# Patient Record
Sex: Female | Born: 1980 | Race: White | Hispanic: No | Marital: Married | Smoking: Never smoker
Health system: Southern US, Community
[De-identification: ages and names within clinical notes are randomized; demographics above are authoritative.]

## PROBLEM LIST (undated history)

## (undated) DIAGNOSIS — O139 Gestational [pregnancy-induced] hypertension without significant proteinuria, unspecified trimester: Secondary | ICD-10-CM

## (undated) DIAGNOSIS — F32A Depression, unspecified: Secondary | ICD-10-CM

## (undated) DIAGNOSIS — F329 Major depressive disorder, single episode, unspecified: Secondary | ICD-10-CM

## (undated) DIAGNOSIS — E282 Polycystic ovarian syndrome: Secondary | ICD-10-CM

## (undated) DIAGNOSIS — F419 Anxiety disorder, unspecified: Secondary | ICD-10-CM

## (undated) DIAGNOSIS — R51 Headache: Secondary | ICD-10-CM

## (undated) HISTORY — PX: MOUTH SURGERY: SHX715

## (undated) HISTORY — PX: LAPAROSCOPY: SHX197

## (undated) HISTORY — PX: TONSILLECTOMY: SUR1361

## (undated) HISTORY — DX: Major depressive disorder, single episode, unspecified: F32.9

## (undated) HISTORY — DX: Gestational (pregnancy-induced) hypertension without significant proteinuria, unspecified trimester: O13.9

## (undated) HISTORY — DX: Depression, unspecified: F32.A

## (undated) HISTORY — DX: Anxiety disorder, unspecified: F41.9

## (undated) HISTORY — DX: Headache: R51

## (undated) HISTORY — DX: Polycystic ovarian syndrome: E28.2

## (undated) HISTORY — PX: LEG SURGERY: SHX1003

---
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
## 2002-04-20 ENCOUNTER — Emergency Department (HOSPITAL_COMMUNITY): Admission: EM | Admit: 2002-04-20 | Discharge: 2002-04-20 | Payer: Self-pay | Admitting: Emergency Medicine

## 2002-06-24 ENCOUNTER — Ambulatory Visit (HOSPITAL_COMMUNITY): Admission: RE | Admit: 2002-06-24 | Discharge: 2002-06-24 | Payer: Self-pay | Admitting: Family Medicine

## 2002-06-24 ENCOUNTER — Encounter: Payer: Self-pay | Admitting: Family Medicine

## 2003-08-27 ENCOUNTER — Inpatient Hospital Stay (HOSPITAL_COMMUNITY): Admission: EM | Admit: 2003-08-27 | Discharge: 2003-08-30 | Payer: Self-pay

## 2004-02-15 ENCOUNTER — Emergency Department (HOSPITAL_COMMUNITY): Admission: EM | Admit: 2004-02-15 | Discharge: 2004-02-16 | Payer: Self-pay | Admitting: Emergency Medicine

## 2004-05-13 ENCOUNTER — Ambulatory Visit (HOSPITAL_COMMUNITY): Admission: RE | Admit: 2004-05-13 | Discharge: 2004-05-13 | Payer: Self-pay | Admitting: Family Medicine

## 2006-02-21 ENCOUNTER — Inpatient Hospital Stay (HOSPITAL_COMMUNITY): Admission: AD | Admit: 2006-02-21 | Discharge: 2006-02-21 | Payer: Self-pay | Admitting: Obstetrics and Gynecology

## 2006-04-11 ENCOUNTER — Inpatient Hospital Stay (HOSPITAL_COMMUNITY): Admission: AD | Admit: 2006-04-11 | Discharge: 2006-04-11 | Payer: Self-pay | Admitting: Obstetrics and Gynecology

## 2006-04-12 ENCOUNTER — Inpatient Hospital Stay (HOSPITAL_COMMUNITY): Admission: AD | Admit: 2006-04-12 | Discharge: 2006-04-15 | Payer: Self-pay | Admitting: Obstetrics and Gynecology

## 2007-01-24 ENCOUNTER — Emergency Department (HOSPITAL_COMMUNITY): Admission: EM | Admit: 2007-01-24 | Discharge: 2007-01-24 | Payer: Self-pay | Admitting: Emergency Medicine

## 2007-04-06 ENCOUNTER — Ambulatory Visit (HOSPITAL_COMMUNITY): Admission: RE | Admit: 2007-04-06 | Discharge: 2007-04-06 | Payer: Self-pay | Admitting: Obstetrics and Gynecology

## 2007-06-27 ENCOUNTER — Encounter: Payer: Self-pay | Admitting: Emergency Medicine

## 2007-06-28 ENCOUNTER — Observation Stay (HOSPITAL_COMMUNITY): Admission: EM | Admit: 2007-06-28 | Discharge: 2007-06-28 | Payer: Self-pay | Admitting: Internal Medicine

## 2010-10-05 HISTORY — PX: OTHER SURGICAL HISTORY: SHX169

## 2010-12-05 NOTE — L&D Delivery Note (Signed)
Delivery Note  SVD viable female Apgars 8,9 over 2nd degree midline laceration.  Placenta delivered spontaneously intact with 3VC. Repair with 2-0 Chromic with good support and hemostasis noted and R/V exam confirms.  Periurethral lac repaired as well  PH art was done.  [place] cord blood was not done.  Mother and baby were doing well.  EBL 400  Mother to AICU for PP mgso4 for Preeclampsia  Candice Camp, MD

## 2011-02-18 LAB — STREP B DNA PROBE: GBS: POSITIVE

## 2011-03-01 LAB — RUBELLA ANTIBODY, IGM: Rubella: IMMUNE

## 2011-03-01 LAB — ABO/RH

## 2011-03-01 LAB — STREP B DNA PROBE: GBS: POSITIVE

## 2011-03-18 LAB — GC/CHLAMYDIA PROBE AMP, GENITAL
Chlamydia: NEGATIVE
Gonorrhea: NEGATIVE

## 2011-03-23 ENCOUNTER — Inpatient Hospital Stay (HOSPITAL_COMMUNITY)
Admission: AD | Admit: 2011-03-23 | Discharge: 2011-03-23 | Disposition: A | Discharge status: Home / Self Care | Payer: BC Managed Care – PPO | Source: Ambulatory Visit | Attending: Obstetrics and Gynecology | Admitting: Obstetrics and Gynecology

## 2011-03-23 DIAGNOSIS — O210 Mild hyperemesis gravidarum: Secondary | ICD-10-CM | POA: Insufficient documentation

## 2011-03-23 LAB — URINALYSIS, ROUTINE W REFLEX MICROSCOPIC
Hgb urine dipstick: NEGATIVE
Ketones, ur: NEGATIVE mg/dL
Protein, ur: NEGATIVE mg/dL
pH: 5.5 (ref 5.0–8.0)

## 2011-03-23 LAB — COMPREHENSIVE METABOLIC PANEL
AST: 20 U/L (ref 0–37)
Albumin: 3.5 g/dL (ref 3.5–5.2)
Chloride: 103 mEq/L (ref 96–112)
Creatinine, Ser: 0.61 mg/dL (ref 0.4–1.2)
GFR calc Af Amer: >60 mL/min (ref >60)
GFR calc non Af Amer: >60 mL/min (ref >60)
Glucose, Bld: 77 mg/dL (ref 70–99)
Potassium: 3.8 mEq/L (ref 3.5–5.1)

## 2011-03-23 LAB — CBC
MCHC: 32.8 g/dL (ref 30.0–36.0)
WBC: 10.9 10*3/uL — ABNORMAL HIGH (ref 4.0–10.5)

## 2011-04-19 NOTE — H&P (Signed)
NAMECOLEEN, CARDIFF NO.:  [id number]   MEDICAL RECORD NO.:  [id number]          PATIENT TYPE:  EMS   LOCATION:  ED                           FACILITY:  [facility]   PHYSICIAN:  Hillery Aldo, M.D.   DATE OF BIRTH:  March 12, 1981   DATE OF ADMISSION:  06/27/2007  DATE OF DISCHARGE:                              HISTORY & PHYSICAL   PRIMARY CARE PHYSICIAN:  Dr. Creta Levin from [place].   CHIEF COMPLAINT:  Chest pain, dyspnea.   HISTORY OF PRESENT ILLNESS:  The patient is a 30 year old female with  past medical history of panic attacks who presented to the ER with the  sudden onset of intermittent chest pain that began initially at 2 p.m.  while the patient was at work.  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  It has been intermittent, sharp and was rated at 10/10 at its worst.  She also reports dyspnea since earlier this morning.  She claims to have  had a similar episode approximately 4 months ago but became concerned  because she does not usually have chest pain with her panic attacks.  Nevertheless, upon initial evaluation in the emergency department, she  remains quite anxious despite having received IV benzodiazepines and is  somewhat tachycardiac, and therefore we are going to admit her to break  this panic attack and to check her thyroid function.   PAST MEDICAL HISTORY:  1. Panic attacks.  2. Endometriosis.  3. Growth plate in the right leg removal.  4. History of migraine headaches.  5. Tonsillectomy.  6. History of viral meningitis.  7. Fatty infiltration of the liver.   FAMILY HISTORY:  The patient's mother is alive at 39 and has rheumatoid  arthritis and thyroid disease.  The patient's father is alive at 74 and  is healthy.  She has one sister who has asthma.   SOCIAL HISTORY:  The patient is married and lives with her husband.  She  is employed full-time in the billing department.   She denies any recent  or different stressors, although she is noted to have an 2-month-old  baby which could be contributory.  She denies tobacco, alcohol and drug  use.   ALLERGIES:  None.   MEDICATIONS:  1. Xanax 0.5 mg b.i.d.  2. Symbyax 6/25 mg daily.   REVIEW OF SYSTEMS:  The patient denies any fever or chills.  She has had  diminished appetite with two episodes of nausea accompanied by vomiting  today.  Chest pain as described above.  No changes in bowel habits.  Her  comprehensive 12-point review of systems is otherwise negative.   PHYSICAL EXAMINATION:  Temperature 98.1, pulse 103, respirations 17,  blood pressure 128/76, O2 saturation 98% on room air.  GENERAL:  This is a well-developed, slightly-obese female who is  somewhat anxious.  HEENT:  Normocephalic, atraumatic.  PERRL.  EOMI.  Oropharynx clear.  NECK:  Supple, no thyromegaly, no lymphadenopathy, no jugular venous  distension.  CHEST:  Lungs clear to auscultation bilaterally with good air  movement.  HEART:  Tachycardiac rate, regular rhythm.  No murmurs, rubs, gallops.  ABDOMEN:  Soft, nontender, nondistended with normoactive bowel sounds.  EXTREMITIES:  No clubbing, edema, cyanosis.  SKIN:  Warm and dry.  No rashes.  NEUROLOGIC:  The patient is alert and oriented x3.  Cranial nerves II-  XII grossly intact.  Moves all extremities x4 with equal strength.  Nonfocal.   DATA REVIEW:  EKG shows normal sinus rhythm with nonspecific T-wave  abnormalities.   Chest x-ray shows low lung volumes with minimal right basilar  atelectasis.   CT scan of the chest shows no pulmonary embolism.  There is a 6-mm  pulmonary nodule of the left lower lobe which is likely a noncalcified  granuloma.   Laboratory data:  White blood cell count was 10, hemoglobin 12.5,  hematocrit 26.1, platelets 325.  Absolute neutrophil count was 5.6.  D-  dimer was negative at 0.44.  Point-of-care cardiac markers are negative.  Sodium is 140,  potassium 3.9, chloride 106, bicarb 23, BUN 8, creatinine  0.74, glucose 91.  Liver function studies are within normal limits.   ASSESSMENT AND PLAN:  1. Panic attack:  We will admit the patient and administer      benzodiazepines p.r.n.  Given her sinus tachycardia, will also      check a TSH and a urine drug screen.  We will admit her for 23-hour      observation.  I will check a 12-lead EKG again in the morning.  The      patient has had three sets of point-of-care cardiac markers here in      the emergency department and there would be no further utility to      further checking cardiac markers given her age, and lack of any      significant risk factors for coronary disease.  2. Prophylaxis:  Will encourage early ambulation for DVT prophylaxis.      No GI prophylaxis will likely be needed as the patient is here for      23-hour observation only.      Hillery Aldo, M.D.  Electronically Signed     CR/MEDQ  D:  06/27/2007  T:  06/28/2007  Job:  [id number]   cc:   Warrick Parisian, MD  [facility]

## 2011-04-19 NOTE — Discharge Summary (Signed)
NAMESHENEKIA, RIESS NO.:  [id number]   MEDICAL RECORD NO.:  [id number]          PATIENT TYPE:  INP   LOCATION:  6736                         FACILITY:  [facility]   PHYSICIAN:  Marcellus Scott, MD     DATE OF BIRTH:  February 22, 1981   DATE OF ADMISSION:  06/28/2007  DATE OF DISCHARGE:  06/28/2007                               DISCHARGE SUMMARY   PRIMARY MEDICAL DOCTOR:  Dr. Creta Levin of [facility] in  [place], [place].   DISCHARGE DIAGNOSES:  1. Panic attacks.  2. Sinus tachycardia, resolved.  3. Lung nodule.  4. Obesity.   DISCHARGE MEDICATIONS:  1. Xanax 0.5 mg tab, 1 to 2 tabs p.o. three times a day p.r.n.  2. Symbyax 6/25 mg, one p.o. daily.   PROCEDURES:  1. A CT angiogram of the chest pulmonary embolism protocol with      impression:  No evidence of pulmonary embolus.  A 6 mm rounded      left lower lobe nodule.  Although nonspecific, a noncalcified      granuloma is favoured.  If the patient has risk factors for lung      cancer, this could be followed by a repeat CT in 6-12 months.      Fatty liver.  2. A chest x-ray.  Impression:  Low lung volumes with minimal right      basilar atelectasis.   PERTINENT LAB RESULTS:  TSH 1.59.  Urine drug screen positive for  benzodiazepines and opiates; however, the patient has received Dilaudid  and Xanax.  Point of care cardiac markers x2 were negative.  D-dimer  0.44.  hepatic panel normal.  Basic metabolic panel normal with BUN of  8, creatinine of 0.74.  CBC is normal with hemoglobin of 12.5,  hematocrit of 36.1, white blood cells 10, platelets of 325.  EKG:  Normal sinus rhythm.   CONSULTATIONS:  None.   HOSPITAL COURSE AND PATIENT DISPOSITION:  For details of the initial  part of the admission, please refer to the history and physical note  that was done by Dr. Darnelle Catalan.  In summary, Ms. Victoria Mccoy is a pleasant 30-  year-old Caucasian female patient with past medical history of panic  attacks, who presented with chest pain and dyspnea.  On further  evaluation she was assessed to have recurrence of her panic attacks with  associated sinus tachycardia.  Pulmonary embolism was ruled out by CT  angiogram of chest.  A cardiac source for her chest pain was deemed less  likely because there was no significant family history, atypical chest  pain history, no personal risk factors apart from being overweight,  negative point of care cardiac markers and no EKG changes.  The patient  was, however, admitted to the hospital for a 23-hour observation.  Her  TSH was checked and normal.  She was placed on Xanax p.r.n. and Symbyax.  Since then her symptoms have improved but she is still having occasional  panic attacks.  There are no known situations or precipitating factors.  When she has these attacks she becomes very anxious with associated  dyspnea, tingling of feet and fingers, tightness or pain of chest and  whole body. The soreness in her chest and body gradually subsides after  sometime after her panic attack has gone. A psychiatric consult was  suggested; however, the patient refused this and she and her husband  indicate that they will follow up with their primary medical doctor and  have made an appointment to see the PMD at 3:50pm this afternoon.  The  patient's sinus tachycardia has resolved.  For an incidental finding of  lung nodule, to be evaluated as outpatient as deemed necessary.  There  is no known risk for lung cancer in this patient.  For her obesity the  patient has been advised regarding diet, weight loss and exercise.      Marcellus Scott, MD  Electronically Signed     AH/MEDQ  D:  06/28/2007  T:  06/28/2007  Job:  [id number]   cc:   Dorisann Frames

## 2011-04-22 NOTE — Discharge Summary (Signed)
NAMEEBONIQUE, HALLSTROM                            ACCOUNT NO.:  [id number]   MEDICAL RECORD NO.:  [id number]                   PATIENT TYPE:  INP   LOCATION:  0383                                 FACILITY:  [facility]   PHYSICIAN:  Sherin Quarry, MD                   DATE OF BIRTH:  [birth year]   DATE OF ADMISSION:  08/27/2003  DATE OF DISCHARGE:  08/30/2003                                 DISCHARGE SUMMARY   HISTORY OF PRESENT ILLNESS:  Victoria Mccoy is a previously healthy 30 year old  young lady who presented to [facility] Emergency Room on August 27, 2003, with a 24 hour history of severe headache with associated fever,  malaise, nausea, vomiting, and anorexia.  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  On presentation to the emergency room,  a CT scan of the brain was done which showed a left maxillary sinus  infection.  Spinal fluid examination showed increased protein, and 181 white  cells with 5 red cells.  Gram stain and subsequent cultures were negative.  The patient was admitted with a presumptive diagnosis of aseptic meningitis.   PHYSICAL EXAMINATION:  VITAL SIGNS:  Temperature was 97.6, pulse was 110,  respirations 22, O2 saturation was 98%.  HEENT:  Remarkable for photophobia.  It was striking that the patient had no  meningismus.  She was able to flex and extend her neck normally.  CHEST:  Clear.  CARDIOVASCULAR:  Normal S1 and S2.  There were no murmurs, rubs, or gallops.  ABDOMEN:  Benign.  NEUROLOGIC:  Normal.  EXTREMITIES:  Normal.   LABORATORY DATA:  Sodium 136, potassium 4.1, chloride 106, bicarbonate 24,  creatinine was 0.9, BUN was 11, glucose was 101.  The Monospot was negative.  Lyme disease serology was negative.  Of interest, was that the herpes  simplex type 1 and type 2 antibodies were positive in both IgG and IgM.  This would be consistent  with previous herpes infection, but would not  necessarily indicate that the current problem was secondary to herpes.   HOSPITAL COURSE:  On admission, the patient was placed on IV of normal  saline 125 cc/hr, Zofran was administered 48 mg IV every six hours p.r.n.  for nausea.  The patient was administered Demerol for headache pain.  She  was also placed on Rocephin 2 g IV every 12 hours until spinal fluid studies  could rule out a bacterial meningitis.  Subsequently, the Rocephin was  discontinued, the patient was placed on Ceftin 250 mg b.i.d. because of the  presence of the maxillary sinusitis on CAT scan.  By August 30, 2003, the  patient was really feeling quite good.  She said that her headache was  essentially resolved.  She was  no longer vomiting.  She was ambulatory.  A  decision was made at that time to discharge her.   DISCHARGE DIAGNOSES:  1. Presumed viral meningitis.  2. Nausea and vomiting secondary to #1.   DISCHARGE MEDICATIONS:  1. The patient was advised to continue Ceftin 250 mg b.i.d. for a total of     10 days.  2. I also gave her Darvocet #20 with instructions to take one or two every     four hours p.r.n. for headache.   ACTIVITY:  She was instructed to stay home from work for about a week.   FOLLOWUP:  She was instructed to see Dr. Dayton Scrape back in the office on  Thursday or Friday, to check on her and also to write her a note to allow  her to return to work at the daycare center.   CONDITION ON DISCHARGE:  Good.                                               Sherin Quarry, MD    SY/MEDQ  D:  08/30/2003  T:  08/30/2003  Job:  [id number]   cc:   Meredith Staggers, M.D.  [address]  [place]  [place] [address]  Fax: [phone]

## 2011-04-22 NOTE — H&P (Signed)
NAMEAMARIANNA, Victoria Mccoy                            ACCOUNT NO.:  [id number]   MEDICAL RECORD NO.:  [id number]                   PATIENT TYPE:  EMS   LOCATION:  ED                                   FACILITY:  [facility]   PHYSICIAN:  Sherin Quarry, MD                   DATE OF BIRTH:  30-May-1981   DATE OF ADMISSION:  08/27/2003  DATE OF DISCHARGE:                                HISTORY & PHYSICAL   HISTORY OF PRESENT ILLNESS:  Victoria Mccoy is a previously healthy 30 year old  lady who presented to [facility] Emergency Room on August 27, 2003, with  onset the previous day of headache described as frontal and throbbing with  associated fever, malaise, nausea, vomiting, and anorexia.  There has been  no history of travel, tick exposure, unusual rash, animal or pet exposure.  Her husband had a virus a few days ago.  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  When she  was seen in the [facility] Emergency Room she was noted to have a  relatively severe headache and was also vomiting.  She was given both  Phenergan and Zofran for the vomiting and Demerol for the headache with  partial relief.  Because of the serious nature of the headache, further  testing was done.  This included a CT scan of the brain which was suggestive  of a left maxillary sinus infection.  The white count was 11,800.  There was  a normal BMET, normal urinalysis.  A spinal fluid examination was done which  showed a cell count on the fifth tube of a white count of 181, red blood  cell count of 5, 80% of the white cells were lymphocytes.  The glucose was  59, protein was 72, the Gram stain was negative.  These findings were felt  to be most compatible with a viral meningitis and she is admitted at this  time for management of this problem.   MEDICATIONS:  She takes no medications.   PAST MEDICAL HISTORY:  She has no serious illnesses.  Her parents recall  that she had  a tonsillectomy as a child and she had a fractured leg in 1994  which required operative treatment.   FAMILY HISTORY:  Everyone in the family is healthy except for the patient's  mother who has a history of rheumatoid arthritis and thyroid disease.   SOCIAL HISTORY:  As previously mentioned, she works at a daycare center.  She is married.  She does not have any children.  There is no history of  alcohol or drug abuse.   REVIEW OF SYSTEMS:  HEAD:  The patient is describing a frontal throbbing  headache.  EYES:  The patient's eyes are sensitive to the light.  EARS,  NOSE, AND THROAT:  There is no earache, sinus  pain, or sore throat.  CHEST:  She denies coughing, wheezing, or chest congestion.  CARDIOVASCULAR:  She  denies orthopnea, PND, ankle edema, or exertional chest pain.  GASTROINTESTINAL:  She has been vomiting.  There has been no abdominal pain.  There has been normal bowel function.  There has been no hematemesis or  melena.  GENITOURINARY:  She denies dysuria, urinary frequency, hesitancy,  or nocturia.  GYNECOLOGIC:  Her last menstrual period was in the last two  weeks.  There has been no unusual bleeding or discharge.  There is no  history of rash.  NEUROLOGIC:  There is no history of history of seizure or  stroke.  ENDOCRINE:  There is no history of excessive thirst, urinary  frequency, or nocturia.   PHYSICAL EXAMINATION:  GENERAL:  The patient is a moderately obese lady.  VITAL SIGNS:  Temperature is 97.6, the pulse is 110, respirations are 22, O2  saturation is 98%.  HEENT:  The eyes are sensitive to light.  The pupils are equal and reactive.  The tympanic membranes are clear.  Nares is patent.  Pharynx is without  erythema or exudate.  There really is very little in the way of meningismus.  The patient can easily flex and extend her neck.  CHEST:  Clear.  BACK:  No CVA or point tenderness.  CARDIOVASCULAR:  Normal S1 and S2.  There are no murmurs, rubs, or gallops.   ABDOMEN:  Obese, normal bowel sounds, without masses, tenderness, or  organomegaly.  NEUROLOGIC:  Cranial nerves, motor and sensory, and cerebellar testing is  normal.  Station and gait were not tested.  EXTREMITIES:  There is no rash.  There are no petechiae.  There are no  ecchymoses.  There is full range of motion.   IMPRESSION:  1. Probable viral meningitis.  2. Nausea and vomiting secondary to #1.   PLAN:  The patient will be admitted chiefly for symptomatic treatment.  We  will administer intravenous fluids and order medication for pain and for  nausea.  I explained to her family that she was likely to be ill for several  days, and that her improvement may be gradual.  To further evaluate her  diagnostically, we will obtain a herpes simplex virus serology by PCR,  Monospot, Lyme disease serology, and also a CMET to look at liver profile.  Also I am going to empirically put her on Rocephin 2 g IV q.12h. for 24  hours just to be absolutely sure that there is no evidence of bacterial  infection which is extremely unlikely.                                               Sherin Quarry, MD    SY/MEDQ  D:  08/27/2003  T:  08/27/2003  Job:  [id number]   cc:   Meredith Staggers, M.D.  [address]  [place]  [place] [address]  Fax: [phone]

## 2011-04-22 NOTE — Op Note (Signed)
[name], Victoria Mccoy NO.:  [id number]   MEDICAL RECORD NO.:  [id number]          PATIENT TYPE:  AMB   LOCATION:  SDC                           FACILITY:  [facility]   PHYSICIAN:  Juluis Mire, M.D.   DATE OF BIRTH:  Apr 06, 1981   DATE OF PROCEDURE:  04/26/2007  DATE OF DISCHARGE:                               OPERATIVE REPORT   PREOPERATIVE DIAGNOSIS:  Pelvic pain.   POSTOPERATIVE DIAGNOSIS:  Endometriosis.   OPERATIVE PROCEDURE:  Diagnostic laparoscopy with cautery of  endometriotic implant.   SURGEON:  Juluis Mire, MD   ANESTHESIA:  General orotracheal.   ESTIMATED BLOOD LOSS:  Minimal.   PACKS AND DRAINS:  None.   INTRAOPERATIVE BLOOD REPLACEMENT:  None.   COMPLICATIONS:  None.   INDICATIONS:  As noted in the history and physical.   PROCEDURE:  The patient was taken to the OR and placed in a supine  position.  After satisfactory level of general orotracheal anesthesia  was obtained, the abdomen, perineum and vagina were prepped out with  Betadine.  Bladder was emptied by in-and-out catheterization.  A Hulka  tenaculum was put in place and secured.  Patient was then draped into a  sterile field.  A subumbilical incision was made with a knife.  The  Veress needle was introduced in the abdominal cavity and the abdomen was  inflated with approximately 4 L of carbon dioxide.  At this point in  time, the operating laparoscope was then introduced.  A 5-mm trocar was  put in place in the suprapubic area under direct visualization.  Visualization revealed no evidence of injury to adjacent organs.  The  appendix was visualized and noted to be normal.  The upper abdomen  including liver and tip of the gallbladder were clear.  Both lateral  gutters were clear.  Uterus was normal size and shape.  Tubes and  ovaries were unremarkable.  In the cul-de-sac area , she did have  whitish lesions consistent with endometriosis directly in the cul-de-sac  and on the left  pelvic sidewall.  We identified the course of the  ureter.  Using the bipolar, we cauterized all areas of active  endometriosis.  At the end of the procedure, we thoroughly irrigated and  removed irrigation.  All areas of endometriosis were actively treated.  At this point in time, the abdomen was deflated of its carbon dioxide  and all trocars removed.  The subumbilical incision was closed with  interrupted subcuticulars of 4-0 Vicryl.  Suprapubic incision was closed  with Dermabond.  The Hulka tenaculum then removed and the patient taken out of the dorsal  lithotomy position and once alert and extubated, transferred to the  recovery room in good condition.  Sponge, instrument and needle count  was reported as correct by circulating nurse.      Juluis Mire, M.D.  Electronically Signed     JSM/MEDQ  D:  04/06/2007  T:  04/06/2007  Job:  [id number]

## 2011-04-22 NOTE — H&P (Signed)
NAME:  Victoria Mccoy, Victoria Mccoy NO.:  [id number]   MEDICAL RECORD NO.:  [id number]          PATIENT TYPE:  AMB   LOCATION:  SDC                           FACILITY:  [facility]   PHYSICIAN:  Juluis Mire, M.D.   DATE OF BIRTH:  06-27-1981   DATE OF ADMISSION:  04/06/2007  DATE OF DISCHARGE:                              HISTORY & PHYSICAL   HISTORY OF PRESENT ILLNESS:  The patient is a 30 year old gravida 1,  para 1 female presents for laparoscopy.   Since delivery of last child, the patient has had increasing pelvic pain  and discomfort.  Ultrasound evaluations have been unremarkable.  We have  discussed options for management and clean hormonal suppression versus  laparoscopy.  The patient is in favor of the latter procedure for which  she presents now.  Presumptively, may be dealing with endometriosis.   ALLERGIES:  NO KNOWN DRUG ALLERGIES.   MEDICATIONS:  Include vitamins.   PAST MEDICAL HISTORY:  Usual childhood diseases.  No significant  sequelae.  History of meningitis in 2004.   PAST SURGERIES:  She has had tonsillectomy and previous leg surgery.   OBSTETRICAL HISTORY:  One vaginal delivery.   FAMILY HISTORY:  Noncontributory.   FAMILY HISTORY:  Denies tobacco, alcohol use.   REVIEW OF SYSTEMS:  Noncontributory.   PHYSICAL EXAMINATION:  VITAL SIGNS:  Patient is afebrile, stable vital  signs.  HEENT: The patient is normocephalic.  Pupils equal and react to light  accommodation.  Extraocular movements were intact.  Sclerae and  conjunctivae clear.  Oropharynx clear.  NECK:  Without thyromegaly.  BREASTS:  No discrete masses.  LUNGS:  Clear.  CARDIOVASCULAR:  Regular rhythm rate without murmurs or gallops.  ABDOMEN:  Benign.  No mass, organomegaly or tenderness.  PELVIC:  Normal external genitalia.  Vaginal mucosa is clear.  Cervix  unremarkable.  Uterus normal size, shape and contour.  Mild cul-de-sac  tenderness.  Adnexa unremarkable.  EXTREMITIES:  Trace  edema.  NEUROLOGICAL:  Grossly within normal limits.   IMPRESSION:  Pelvic pain, rule out endometriosis.   PLAN:  The patient will undergo diagnostic laparoscopy, laser standby.  The risks of surgery have been discussed including the risk of  infection; the risk of hemorrhage that could require transfusion with  the risk of AIDS or  hepatitis; risk of injury to adjacent organs including bladder, bowel,  ureters that could require further exploratory surgery; risk of deep  venous thrombosis and pulmonary emboli.  The patient expressed  understanding of indications and risks.      Juluis Mire, M.D.  Electronically Signed     JSM/MEDQ  D:  04/06/2007  T:  04/06/2007  Job:  [id number]

## 2011-06-10 ENCOUNTER — Inpatient Hospital Stay (HOSPITAL_COMMUNITY)
Admission: EM | Admit: 2011-06-10 | Discharge: 2011-06-10 | Disposition: A | Discharge status: Home / Self Care | Payer: BC Managed Care – PPO | Source: Ambulatory Visit | Attending: Obstetrics and Gynecology | Admitting: Obstetrics and Gynecology

## 2011-06-10 DIAGNOSIS — O139 Gestational [pregnancy-induced] hypertension without significant proteinuria, unspecified trimester: Secondary | ICD-10-CM | POA: Insufficient documentation

## 2011-06-10 LAB — URINALYSIS, ROUTINE W REFLEX MICROSCOPIC
Glucose, UA: NEGATIVE mg/dL
Hgb urine dipstick: NEGATIVE
Nitrite: NEGATIVE
Urobilinogen, UA: 0.2 mg/dL (ref 0.0–1.0)

## 2011-06-13 LAB — URINE CULTURE: Culture  Setup Time: 201207070124

## 2011-09-15 ENCOUNTER — Telehealth (HOSPITAL_COMMUNITY): Payer: Self-pay | Admitting: *Deleted

## 2011-09-15 ENCOUNTER — Encounter (HOSPITAL_COMMUNITY): Payer: Self-pay | Admitting: *Deleted

## 2011-09-15 NOTE — Telephone Encounter (Signed)
Preadmission screen  

## 2011-09-16 ENCOUNTER — Encounter (HOSPITAL_COMMUNITY): Payer: Self-pay | Admitting: *Deleted

## 2011-09-19 ENCOUNTER — Encounter (HOSPITAL_COMMUNITY): Payer: Self-pay

## 2011-09-19 ENCOUNTER — Inpatient Hospital Stay (HOSPITAL_COMMUNITY)
Admission: RE | Admit: 2011-09-19 | Discharge: 2011-09-22 | DRG: 372 | Disposition: A | Discharge status: Home / Self Care | Payer: BC Managed Care – PPO | Source: Ambulatory Visit | Attending: Obstetrics and Gynecology | Admitting: Obstetrics and Gynecology

## 2011-09-19 DIAGNOSIS — O99892 Other specified diseases and conditions complicating childbirth: Secondary | ICD-10-CM | POA: Diagnosis present

## 2011-09-19 DIAGNOSIS — O139 Gestational [pregnancy-induced] hypertension without significant proteinuria, unspecified trimester: Secondary | ICD-10-CM

## 2011-09-19 DIAGNOSIS — Z22330 Carrier of Group B streptococcus: Secondary | ICD-10-CM

## 2011-09-19 DIAGNOSIS — IMO0002 Reserved for concepts with insufficient information to code with codable children: Secondary | ICD-10-CM | POA: Diagnosis present

## 2011-09-19 HISTORY — DX: Gestational (pregnancy-induced) hypertension without significant proteinuria, unspecified trimester: O13.9

## 2011-09-19 LAB — BASIC METABOLIC PANEL
BUN: 8
CO2: 23
Creatinine, Ser: 0.74
GFR calc Af Amer: >60
GFR calc non Af Amer: >60
Glucose, Bld: 91
Sodium: 140

## 2011-09-19 LAB — COMPREHENSIVE METABOLIC PANEL
AST: 19 U/L (ref 0–37)
Alkaline Phosphatase: 134 U/L — ABNORMAL HIGH (ref 39–117)
BUN: 7 mg/dL (ref 6–23)
CO2: 20 mEq/L (ref 19–32)
Chloride: 99 mEq/L (ref 96–112)
Creatinine, Ser: 0.56 mg/dL (ref 0.50–1.10)
GFR calc non Af Amer: >90 mL/min (ref >90)
Total Bilirubin: 0.2 mg/dL — ABNORMAL LOW (ref 0.3–1.2)

## 2011-09-19 LAB — CBC
HCT: 36.1
HCT: 36.6 % (ref 36.0–46.0)
Hemoglobin: 12.5
MCHC: 34.5
MCV: 86.7
MCV: 91 fL (ref 78.0–100.0)
Platelets: 325
RBC: 4.17
RBC: 4.02 MIL/uL (ref 3.87–5.11)
RDW: 13.2
WBC: 10
WBC: 10.8 10*3/uL — ABNORMAL HIGH (ref 4.0–10.5)

## 2011-09-19 LAB — POCT CARDIAC MARKERS
CKMB, poc: <1 — ABNORMAL LOW
CKMB, poc: <1 — ABNORMAL LOW
Myoglobin, poc: 52
Myoglobin, poc: 60.4
Operator id: 4661
Operator id: 4661
Troponin i, poc: <0.05
Troponin i, poc: <0.05

## 2011-09-19 LAB — RAPID URINE DRUG SCREEN, HOSP PERFORMED
Amphetamines: NOT DETECTED
Barbiturates: NOT DETECTED
Benzodiazepines: POSITIVE — AB
Cocaine: NOT DETECTED
Opiates: POSITIVE — AB
Tetrahydrocannabinol: NOT DETECTED

## 2011-09-19 LAB — URINALYSIS, ROUTINE W REFLEX MICROSCOPIC
Bilirubin Urine: NEGATIVE
Hgb urine dipstick: NEGATIVE
Ketones, ur: NEGATIVE mg/dL
Nitrite: NEGATIVE
pH: 7 (ref 5.0–8.0)

## 2011-09-19 LAB — HEPATIC FUNCTION PANEL
ALT: 33
AST: 30
Albumin: 3.8
Alkaline Phosphatase: 83
Bilirubin, Direct: <0.1
Total Bilirubin: 0.7
Total Protein: 7.2

## 2011-09-19 LAB — D-DIMER, QUANTITATIVE: D-Dimer, Quant: 0.44

## 2011-09-19 LAB — URINE CULTURE: Colony Count: >=100000

## 2011-09-19 LAB — DIFFERENTIAL
Basophils Absolute: 0
Basophils Relative: 0
Eosinophils Absolute: 0.4
Eosinophils Relative: 4
Lymphocytes Relative: 32
Lymphs Abs: 3.2
Monocytes Absolute: 0.5
Monocytes Relative: 5
Neutro Abs: 5.9
Neutrophils Relative %: 58

## 2011-09-19 LAB — URIC ACID: Uric Acid, Serum: 4.4 mg/dL (ref 2.4–7.0)

## 2011-09-19 LAB — URINE MICROSCOPIC-ADD ON

## 2011-09-19 LAB — BASIC METABOLIC PANEL WITH GFR
Calcium: 9.3
Chloride: 106
Potassium: 3.9

## 2011-09-19 LAB — LACTATE DEHYDROGENASE: LDH: 238 U/L (ref 94–250)

## 2011-09-19 LAB — TSH: TSH: 1.59

## 2011-09-19 MED ORDER — SODIUM CHLORIDE 0.9 % IV SOLN
2.0000 g | Freq: Once | INTRAVENOUS | Status: DC
  Administered 2011-09-19: 2 g via INTRAVENOUS
  Filled 2011-09-19: qty 2000

## 2011-09-19 MED ORDER — BUTORPHANOL TARTRATE 2 MG/ML IJ SOLN
1.0000 mg | INTRAMUSCULAR | Status: DC | PRN
  Administered 2011-09-20 (×2): 1 mg via INTRAVENOUS
  Filled 2011-09-19: qty 1

## 2011-09-19 MED ORDER — OXYTOCIN 20 UNITS IN LACTATED RINGERS INFUSION - SIMPLE: 125.0000 mL/h | Freq: Once | INTRAVENOUS | Status: DC

## 2011-09-19 MED ORDER — LABETALOL HCL 200 MG PO TABS
200.0000 mg | ORAL_TABLET | Freq: Two times a day (BID) | ORAL | Status: DC
  Administered 2011-09-19 – 2011-09-20 (×3): 200 mg via ORAL
  Filled 2011-09-19 (×4): qty 1

## 2011-09-19 MED ORDER — ACETAMINOPHEN 325 MG PO TABS
650.0000 mg | ORAL_TABLET | ORAL | Status: DC | PRN
  Administered 2011-09-19: 650 mg via ORAL
  Filled 2011-09-19: qty 1

## 2011-09-19 MED ORDER — LACTATED RINGERS IV SOLN
500.0000 mL | INTRAVENOUS | Status: DC | PRN
  Administered 2011-09-19: 500 mL via INTRAVENOUS

## 2011-09-19 MED ORDER — IBUPROFEN 600 MG PO TABS: 600.0000 mg | ORAL_TABLET | Freq: Four times a day (QID) | ORAL | Status: DC | PRN

## 2011-09-19 MED ORDER — OXYCODONE-ACETAMINOPHEN 5-325 MG PO TABS
2.0000 | ORAL_TABLET | ORAL | Status: DC | PRN
  Filled 2011-09-19: qty 1

## 2011-09-19 MED ORDER — FLEET ENEMA 7-19 GM/118ML RE ENEM: 1.0000 | ENEMA | RECTAL | Status: DC | PRN

## 2011-09-19 MED ORDER — CITRIC ACID-SODIUM CITRATE 334-500 MG/5ML PO SOLN: 30.0000 mL | ORAL | Status: DC | PRN

## 2011-09-19 MED ORDER — OXYTOCIN BOLUS FROM INFUSION
500.0000 mL | Freq: Once | INTRAVENOUS | Status: DC
  Filled 2011-09-19: qty 500
  Filled 2011-09-19: qty 1000

## 2011-09-19 MED ORDER — TERBUTALINE SULFATE 1 MG/ML IJ SOLN: 0.2500 mg | Freq: Once | INTRAMUSCULAR | Status: AC | PRN

## 2011-09-19 MED ORDER — LIDOCAINE HCL (PF) 1 % IJ SOLN
30.0000 mL | INTRAMUSCULAR | Status: DC | PRN
  Filled 2011-09-19: qty 30

## 2011-09-19 MED ORDER — LACTATED RINGERS IV SOLN
INTRAVENOUS | Status: DC
  Administered 2011-09-19 – 2011-09-20 (×3): via INTRAVENOUS

## 2011-09-19 MED ORDER — ONDANSETRON HCL 4 MG/2ML IJ SOLN: 4.0000 mg | Freq: Four times a day (QID) | INTRAMUSCULAR | Status: DC | PRN

## 2011-09-19 MED ORDER — MISOPROSTOL 25 MCG QUARTER TABLET
25.0000 ug | ORAL_TABLET | ORAL | Status: DC | PRN
  Administered 2011-09-19 – 2011-09-20 (×3): 25 ug via VAGINAL
  Filled 2011-09-19: qty 0.25
  Filled 2011-09-19: qty 1
  Filled 2011-09-19 (×2): qty 0.25

## 2011-09-19 NOTE — H&P (Signed)
Victoria Mccoy is a 30 y.o. female presenting for induction of labor secondary to worsening PIH.  Taking labetolol 200mg , bid and BPs going up over last week. Today C/O mild HA over several days, no blurry vision, no epigastric pain, no ROM/bleeding. Maternal Medical History:  Fetal activity: Perceived fetal activity is normal.      OB History    Grav Para Term Preterm Abortions TAB SAB Ect Mult Living   2 1 1  0 0 0 0 0 0 1     Past Medical History  Diagnosis Date  . PCOS (polycystic ovarian syndrome)   . Endometriosis 2008    mild  . Pregnancy induced hypertension     with last pregnancy and this one  . Headache     migraines  . Depression     pp depression  . Anxiety     hx anxiety attacks  . Asthma    Past Surgical History  Procedure Date  . Laparoscopy with co2 laser ablation 10/2010  . Tonsillectomy     30 years old  . Leg surgery     R leg growth plate 30 years old  . Mouth surgery   . Laparoscopy     111/2011   Family History: family history includes Arthritis in her mother; Cancer in her maternal grandfather and paternal grandfather; Diabetes in her maternal grandmother; Hypertension in her maternal grandmother and paternal grandmother; and Thyroid disease in her mother.  There is no history of Anesthesia problems, and Hypotension, and Malignant hyperthermia, and Pseudochol deficiency, . Social History:  reports that she has never smoked. She has never used smokeless tobacco. She reports that she does not drink alcohol or use illicit drugs.  Review of Systems  Eyes: Negative for blurred vision.  Gastrointestinal: Negative for abdominal pain.  Neurological: Positive for headaches.    Dilation: 2 Effacement (%): 40 Exam by:: l.poore, rn Blood pressure 157/107, pulse 122, temperature 98.6 F (37 C), temperature source Oral, resp. rate 18, height 5\' 4"  (1.626 m), weight 112.492 kg (248 lb), last menstrual period 12/24/2010. Maternal Exam:  Abdomen: Fetal  presentation: vertex     Physical Exam  Constitutional: She appears well-developed and well-nourished.  Cardiovascular: Normal rate and regular rhythm.   Respiratory: Effort normal and breath sounds normal.  GI: There is no tenderness.  Neurological: She has normal reflexes.   U/S at bedside confirms vtx. Prenatal labs: ABO, Rh: A/Positive/-- (03/27 0000) Antibody: Negative (03/27 0000) Rubella: Immune (03/27 0000) RPR: Nonreactive (03/27 0000)  HBsAg: Negative (03/27 0000)  HIV: Non-reactive (03/27 0000)  GBS: Positive (03/27 0000)   Assessment/Plan: 30 yo G2P1 at 66 5/7 weeks with HTN and worsening PIH.  Mild HA for several days.  Will give tylenol while labs and UA are being done.  Continue labetolol 200mg , bid.  If HA persists, labs are suspicious, will begin Magnesium sulfate for seizure prophylaxis. D/W pt risks of induction of labor including uterine hyperstimulation, uterine rupture, fetal distress, emergency C/S.   Kameran Lallier II,Juanjose Mojica E 09/19/2011, 8:59 PM

## 2011-09-19 NOTE — Plan of Care (Signed)
Problem: Consults Goal: Birthing Suites Patient Information Press F2 to bring up selections list  Outcome: Completed/Met Date Met:  09/19/11  Pt 37-[redacted] weeks EGA, Inpatient induction and PIH (Pregnancy induced hypertension)

## 2011-09-20 ENCOUNTER — Inpatient Hospital Stay (HOSPITAL_COMMUNITY): Payer: BC Managed Care – PPO | Admitting: Anesthesiology

## 2011-09-20 ENCOUNTER — Encounter (HOSPITAL_COMMUNITY): Payer: Self-pay

## 2011-09-20 ENCOUNTER — Encounter (HOSPITAL_COMMUNITY): Payer: Self-pay | Admitting: Anesthesiology

## 2011-09-20 LAB — CBC
MCH: 30.3 pg (ref 26.0–34.0)
MCHC: 32.8 g/dL (ref 30.0–36.0)
MCV: 92.3 fL (ref 78.0–100.0)
Platelets: 257 10*3/uL (ref 150–400)
Platelets: 256 10*3/uL (ref 150–400)
RBC: 3.78 MIL/uL — ABNORMAL LOW (ref 3.87–5.11)
RDW: 14.5 % (ref 11.5–15.5)
WBC: 11.1 10*3/uL — ABNORMAL HIGH (ref 4.0–10.5)

## 2011-09-20 MED ORDER — DIPHENHYDRAMINE HCL 25 MG PO CAPS: 25.0000 mg | ORAL_CAPSULE | Freq: Four times a day (QID) | ORAL | Status: DC | PRN

## 2011-09-20 MED ORDER — LACTATED RINGERS IV SOLN: 500.0000 mL | Freq: Once | INTRAVENOUS | Status: DC

## 2011-09-20 MED ORDER — FENTANYL 2.5 MCG/ML BUPIVACAINE 1/10 % EPIDURAL INFUSION (WH - ANES)
14.0000 mL/h | INTRAMUSCULAR | Status: DC
  Administered 2011-09-20 (×2): 14 mL/h via EPIDURAL
  Filled 2011-09-20 (×2): qty 60

## 2011-09-20 MED ORDER — WITCH HAZEL-GLYCERIN EX PADS: 1.0000 "application " | MEDICATED_PAD | CUTANEOUS | Status: DC | PRN

## 2011-09-20 MED ORDER — MAGNESIUM SULFATE 40 G IN LACTATED RINGERS - SIMPLE
2.0000 g/h | INTRAVENOUS | Status: DC
  Administered 2011-09-20: 2 g/h via INTRAVENOUS
  Filled 2011-09-20: qty 500

## 2011-09-20 MED ORDER — LANOLIN HYDROUS EX OINT: TOPICAL_OINTMENT | CUTANEOUS | Status: DC | PRN

## 2011-09-20 MED ORDER — SODIUM CHLORIDE 0.9 % IV SOLN
2.0000 g | INTRAVENOUS | Status: DC
  Administered 2011-09-20 (×2): 2 g via INTRAVENOUS
  Filled 2011-09-20 (×6): qty 2000

## 2011-09-20 MED ORDER — BENZOCAINE-MENTHOL 20-0.5 % EX AERO
1.0000 "application " | INHALATION_SPRAY | CUTANEOUS | Status: DC | PRN
  Administered 2011-09-20: 1 via TOPICAL

## 2011-09-20 MED ORDER — OXYCODONE-ACETAMINOPHEN 5-325 MG PO TABS
1.0000 | ORAL_TABLET | ORAL | Status: DC | PRN
  Administered 2011-09-20: 2 via ORAL
  Administered 2011-09-20: 1 via ORAL
  Administered 2011-09-21 – 2011-09-22 (×6): 2 via ORAL
  Administered 2011-09-22: 1 via ORAL
  Filled 2011-09-20 (×8): qty 2
  Filled 2011-09-20: qty 1

## 2011-09-20 MED ORDER — EPHEDRINE 5 MG/ML INJ
10.0000 mg | INTRAVENOUS | Status: DC | PRN
  Filled 2011-09-20 (×2): qty 4

## 2011-09-20 MED ORDER — TETANUS-DIPHTH-ACELL PERTUSSIS 5-2.5-18.5 LF-MCG/0.5 IM SUSP
0.5000 mL | Freq: Once | INTRAMUSCULAR | Status: DC
  Filled 2011-09-20: qty 0.5

## 2011-09-20 MED ORDER — OXYTOCIN 20 UNITS IN LACTATED RINGERS INFUSION - SIMPLE
1.0000 m[IU]/min | INTRAVENOUS | Status: DC
  Administered 2011-09-20: 2 m[IU]/min via INTRAVENOUS
  Filled 2011-09-20: qty 1000

## 2011-09-20 MED ORDER — PHENYLEPHRINE 40 MCG/ML (10ML) SYRINGE FOR IV PUSH (FOR BLOOD PRESSURE SUPPORT)
80.0000 ug | PREFILLED_SYRINGE | INTRAVENOUS | Status: DC | PRN
  Filled 2011-09-20 (×2): qty 5

## 2011-09-20 MED ORDER — MEDROXYPROGESTERONE ACETATE 150 MG/ML IM SUSP: 150.0000 mg | INTRAMUSCULAR | Status: DC | PRN

## 2011-09-20 MED ORDER — ONDANSETRON HCL 4 MG/2ML IJ SOLN: 4.0000 mg | INTRAMUSCULAR | Status: DC | PRN

## 2011-09-20 MED ORDER — LIDOCAINE HCL 1.5 % IJ SOLN
INTRAMUSCULAR | Status: DC | PRN
  Administered 2011-09-20 (×2): 5 mL via INTRADERMAL

## 2011-09-20 MED ORDER — ONDANSETRON HCL 4 MG PO TABS: 4.0000 mg | ORAL_TABLET | ORAL | Status: DC | PRN

## 2011-09-20 MED ORDER — CITRIC ACID-SODIUM CITRATE 334-500 MG/5ML PO SOLN
ORAL | Status: AC
  Filled 2011-09-20: qty 15

## 2011-09-20 MED ORDER — PHENYLEPHRINE 40 MCG/ML (10ML) SYRINGE FOR IV PUSH (FOR BLOOD PRESSURE SUPPORT)
80.0000 ug | PREFILLED_SYRINGE | INTRAVENOUS | Status: DC | PRN
  Filled 2011-09-20: qty 5

## 2011-09-20 MED ORDER — LACTATED RINGERS IV SOLN
INTRAVENOUS | Status: DC
  Administered 2011-09-20 – 2011-09-21 (×2): via INTRAVENOUS

## 2011-09-20 MED ORDER — SENNOSIDES-DOCUSATE SODIUM 8.6-50 MG PO TABS
2.0000 | ORAL_TABLET | Freq: Every day | ORAL | Status: DC
  Administered 2011-09-20 – 2011-09-21 (×2): 2 via ORAL

## 2011-09-20 MED ORDER — BENZOCAINE-MENTHOL 20-0.5 % EX AERO
INHALATION_SPRAY | CUTANEOUS | Status: AC
  Administered 2011-09-20: 1 via TOPICAL
  Filled 2011-09-20: qty 56

## 2011-09-20 MED ORDER — IBUPROFEN 600 MG PO TABS
600.0000 mg | ORAL_TABLET | Freq: Four times a day (QID) | ORAL | Status: DC
  Administered 2011-09-20 – 2011-09-22 (×8): 600 mg via ORAL
  Filled 2011-09-20 (×9): qty 1

## 2011-09-20 MED ORDER — PRENATAL PLUS 27-1 MG PO TABS
1.0000 | ORAL_TABLET | Freq: Every day | ORAL | Status: DC
  Administered 2011-09-21 – 2011-09-22 (×2): 1 via ORAL
  Filled 2011-09-20 (×2): qty 1

## 2011-09-20 MED ORDER — DIPHENHYDRAMINE HCL 50 MG/ML IJ SOLN: 12.5000 mg | INTRAMUSCULAR | Status: DC | PRN

## 2011-09-20 MED ORDER — LACTATED RINGERS IV SOLN
INTRAVENOUS | Status: DC
  Administered 2011-09-20: 14:00:00 via INTRAUTERINE

## 2011-09-20 MED ORDER — SIMETHICONE 80 MG PO CHEW: 80.0000 mg | CHEWABLE_TABLET | ORAL | Status: DC | PRN

## 2011-09-20 MED ORDER — MEASLES, MUMPS & RUBELLA VAC ~~LOC~~ INJ
0.5000 mL | INJECTION | Freq: Once | SUBCUTANEOUS | Status: DC
  Filled 2011-09-20: qty 0.5

## 2011-09-20 MED ORDER — EPHEDRINE 5 MG/ML INJ
10.0000 mg | INTRAVENOUS | Status: DC | PRN
  Filled 2011-09-20: qty 4

## 2011-09-20 MED ORDER — ZOLPIDEM TARTRATE 5 MG PO TABS: 5.0000 mg | ORAL_TABLET | Freq: Every evening | ORAL | Status: DC | PRN

## 2011-09-20 MED ORDER — MAGNESIUM SULFATE BOLUS VIA INFUSION
4.0000 g | Freq: Once | INTRAVENOUS | Status: AC
  Administered 2011-09-20: 4 g via INTRAVENOUS
  Filled 2011-09-20: qty 500

## 2011-09-20 MED ORDER — DIBUCAINE 1 % RE OINT: 1.0000 "application " | TOPICAL_OINTMENT | RECTAL | Status: DC | PRN

## 2011-09-20 MED ORDER — TERBUTALINE SULFATE 1 MG/ML IJ SOLN: 0.2500 mg | Freq: Once | INTRAMUSCULAR | Status: DC | PRN

## 2011-09-20 NOTE — Anesthesia Postprocedure Evaluation (Signed)
  Anesthesia Post-op Note  Patient: Victoria Mccoy  Procedure(s) Performed: * No procedures listed *  Patient Location: 372   Anesthesia Type: Epidural  Level of Consciousness: awake, alert  and oriented  Airway and Oxygen Therapy: Patient Spontanous Breathing  Post-op Pain: mild  Post-op Assessment: Post-op Vital signs reviewed, Patient's Cardiovascular Status Stable, No headache, No backache, No residual numbness and No residual motor weakness  Post-op Vital Signs: Reviewed and stable  Complications: No apparent anesthesia complications

## 2011-09-20 NOTE — Progress Notes (Signed)
Pt. Attempted to ambulate to bathroom but unable to do so because of dizziness.  Straight cathed for 350cc clear yellow urine.  Pericare done and pad placed.  Pt.  In wheelchair and transferred to room 372 without difficulty.  Family at side.

## 2011-09-20 NOTE — Progress Notes (Signed)
Delivered viable female with apgars 8/9.  Placenta at 1451.  2nd degree perineal laceration repaired.  Pitocin 20 units  At bolus rate past placenta.

## 2011-09-20 NOTE — Anesthesia Preprocedure Evaluation (Signed)
Anesthesia Evaluation  Name, MR# and DOB Patient awake  General Assessment Comment  Reviewed: Allergy & Precautions, H&P , Patient's Chart, lab work & pertinent test results  Airway Mallampati: III TM Distance: >3 FB Neck ROM: full    Dental No notable dental hx.    Pulmonary asthma  clear to auscultation  Pulmonary exam normal       Cardiovascular hypertension, regular Normal    Neuro/Psych Negative Neurological ROS  Negative Psych ROS   GI/Hepatic negative GI ROS Neg liver ROS    Endo/Other  Negative Endocrine ROSMorbid obesity  Renal/GU negative Renal ROS     Musculoskeletal   Abdominal   Peds  Hematology negative hematology ROS (+)   Anesthesia Other Findings pih  Reproductive/Obstetrics (+) Pregnancy                           Anesthesia Physical Anesthesia Plan  ASA: III  Anesthesia Plan: Epidural   Post-op Pain Management:    Induction:   Airway Management Planned:   Additional Equipment:   Intra-op Plan:   Post-operative Plan:   Informed Consent: I have reviewed the patients History and Physical, chart, labs and discussed the procedure including the risks, benefits and alternatives for the proposed anesthesia with the patient or authorized representative who has indicated his/her understanding and acceptance.     Plan Discussed with:   Anesthesia Plan Comments:         Anesthesia Quick Evaluation

## 2011-09-20 NOTE — Progress Notes (Signed)
Victoria Mccoy is a 30 y.o. G2P1001 at [redacted]w[redacted]d by LMP admitted for induction of labor due to Hypertension.  Subjective:   Objective: BP 145/87  Pulse 97  Temp(Src) 98.5 F (36.9 C) (Oral)  Resp 18  Ht 5\' 4"  (1.626 m)  Wt 112.492 kg (248 lb)  BMI 42.57 kg/m2  SpO2 98%  LMP 12/24/2010 I/O last 3 completed shifts: In: 1945.4 [P.O.:500; I.V.:1445.4] Out: 1000 [Urine:1000] Total I/O In: 225 [P.O.:100; I.V.:125] Out: -   FHT:  FHR: 140 bpm, variability: moderate,  accelerations:  Present,  decelerations:  Absent UC:   irregular, every 5 minutes, regular, every  minutes SVE:   Dilation: 2 Effacement (%): 60;70 Station: -3 Exam by:: l.poore, rn  Labs: Lab Results  Component Value Date   WBC 10.8* 09/19/2011   HGB 12.2 09/19/2011   HCT 36.6 09/19/2011   MCV 91.0 09/19/2011   PLT 286 09/19/2011    Assessment / Plan: Induction of labor due to preeclampsia,  progressing well on pitocin  Labor: Progressing normally Preeclampsia:  on magnesium sulfate Fetal Wellbeing:  Category I and  Pain Control:  Labor support without medications I/D:  n/a Anticipated MOD:  NSVD abx for gbs Epidural prn Lennard Capek C 09/20/2011, 8:30 AM

## 2011-09-20 NOTE — Anesthesia Procedure Notes (Signed)
Epidural Patient location during procedure: OB Start time: 09/20/2011 10:40 AM  Staffing Performed by: anesthesiologist   Preanesthetic Checklist Completed: patient identified, site marked, surgical consent, pre-op evaluation, timeout performed, IV checked, risks and benefits discussed and monitors and equipment checked  Epidural Patient position: sitting Prep: site prepped and draped and DuraPrep Patient monitoring: continuous pulse ox and blood pressure Approach: midline Injection technique: LOR air and LOR saline  Needle:  Needle type: Tuohy  Needle gauge: 17 G Needle length: 9 cm Needle insertion depth: 6 cm Catheter type: closed end flexible Catheter size: 19 Gauge Catheter at skin depth: 11 cm Test dose: negative  Assessment Events: blood not aspirated, injection not painful, no injection resistance, negative IV test and no paresthesia  Additional Notes Patient identified.  Risk benefits discussed including failed block, incomplete pain control, headache, nerve damage, paralysis, blood pressure changes, nausea, vomiting, reactions to medication both toxic or allergic, and postpartum back pain.  Patient expressed understanding and wished to proceed.  All questions were answered.  Sterile technique used throughout procedure and epidural site dressed with sterile barrier dressing. No paresthesia or other complications noted.The patient did not experience any signs of intravascular injection such as tinnitus or metallic taste in mouth nor signs of intrathecal spread such as rapid motor block. Please see nursing notes for vital signs.

## 2011-09-21 LAB — COMPREHENSIVE METABOLIC PANEL
ALT: 8 U/L (ref 0–35)
AST: 13 U/L (ref 0–37)
Albumin: 2.4 g/dL — ABNORMAL LOW (ref 3.5–5.2)
Alkaline Phosphatase: 97 U/L (ref 39–117)
Chloride: 106 mEq/L (ref 96–112)
Potassium: 3.7 mEq/L (ref 3.5–5.1)
Sodium: 138 mEq/L (ref 135–145)
Total Bilirubin: 0.2 mg/dL — ABNORMAL LOW (ref 0.3–1.2)

## 2011-09-21 LAB — CBC
Platelets: 218 10*3/uL (ref 150–400)
RDW: 14.8 % (ref 11.5–15.5)
WBC: 11.8 10*3/uL — ABNORMAL HIGH (ref 4.0–10.5)

## 2011-09-21 LAB — URIC ACID: Uric Acid, Serum: 5.4 mg/dL (ref 2.4–7.0)

## 2011-09-21 NOTE — Progress Notes (Signed)
Post Partum Day one Subjective: no complaints  Objective: Blood pressure 126/84, pulse 78, temperature 97.7 F (36.5 C), temperature source Oral, resp. rate 20, height 5\' 4"  (1.626 m), weight 239 lb 11.2 oz (108.727 kg), last menstrual period 12/24/2010, SpO2 99.00%, unknown if currently breastfeeding.  Physical Exam:  General: alert Lochia: appropriate Uterine Fundus: firm DTR" 1 + DVT Evaluation: No evidence of DVT seen on physical exam.   Basename 09/21/11 0535 09/20/11 1623  HGB 9.0* 10.8*  HCT 27.6* 32.9*    Assessment/Plan: Plan for discharge tomorrow.  D/c magnesium   LOS: 2 days   Victoria Mccoy 09/21/2011, 9:05 AM

## 2011-09-21 NOTE — Progress Notes (Signed)
UR chart review completed.  

## 2011-09-22 LAB — CBC
HCT: 28.1 % — ABNORMAL LOW (ref 36.0–46.0)
Hemoglobin: 8.9 g/dL — ABNORMAL LOW (ref 12.0–15.0)
MCV: 95.3 fL (ref 78.0–100.0)
RDW: 15.2 % (ref 11.5–15.5)
WBC: 14.1 10*3/uL — ABNORMAL HIGH (ref 4.0–10.5)

## 2011-09-22 MED ORDER — ONDANSETRON HCL 4 MG/2ML IJ SOLN: 4.0000 mg | INTRAMUSCULAR | Status: DC | PRN

## 2011-09-22 MED ORDER — BISACODYL 10 MG RE SUPP: 10.0000 mg | Freq: Every day | RECTAL | Status: DC | PRN

## 2011-09-22 MED ORDER — ONDANSETRON HCL 4 MG PO TABS: 4.0000 mg | ORAL_TABLET | ORAL | Status: DC | PRN

## 2011-09-22 MED ORDER — ZOLPIDEM TARTRATE 5 MG PO TABS: 5.0000 mg | ORAL_TABLET | Freq: Every evening | ORAL | Status: DC | PRN

## 2011-09-22 MED ORDER — IBUPROFEN 600 MG PO TABS: 600.0000 mg | ORAL_TABLET | Freq: Four times a day (QID) | ORAL | Status: AC

## 2011-09-22 MED ORDER — SIMETHICONE 80 MG PO CHEW: 80.0000 mg | CHEWABLE_TABLET | ORAL | Status: DC | PRN

## 2011-09-22 MED ORDER — OXYCODONE-ACETAMINOPHEN 5-325 MG PO TABS: 1.0000 | ORAL_TABLET | ORAL | Status: DC | PRN

## 2011-09-22 MED ORDER — IBUPROFEN 600 MG PO TABS: 600.0000 mg | ORAL_TABLET | Freq: Four times a day (QID) | ORAL | Status: DC

## 2011-09-22 MED ORDER — LANOLIN HYDROUS EX OINT: TOPICAL_OINTMENT | CUTANEOUS | Status: DC | PRN

## 2011-09-22 MED ORDER — DIBUCAINE 1 % RE OINT: 1.0000 "application " | TOPICAL_OINTMENT | RECTAL | Status: DC | PRN

## 2011-09-22 MED ORDER — DIPHENHYDRAMINE HCL 25 MG PO CAPS: 25.0000 mg | ORAL_CAPSULE | Freq: Four times a day (QID) | ORAL | Status: DC | PRN

## 2011-09-22 MED ORDER — PRENATAL PLUS 27-1 MG PO TABS: 1.0000 | ORAL_TABLET | Freq: Every day | ORAL | Status: DC

## 2011-09-22 MED ORDER — WITCH HAZEL-GLYCERIN EX PADS: 1.0000 "application " | MEDICATED_PAD | CUTANEOUS | Status: DC | PRN

## 2011-09-22 MED ORDER — OXYCODONE-ACETAMINOPHEN 5-325 MG PO TABS: 1.0000 | ORAL_TABLET | ORAL | Status: AC | PRN

## 2011-09-22 MED ORDER — SENNOSIDES-DOCUSATE SODIUM 8.6-50 MG PO TABS: 2.0000 | ORAL_TABLET | Freq: Every day | ORAL | Status: DC

## 2011-09-22 MED ORDER — BENZOCAINE-MENTHOL 20-0.5 % EX AERO: 1.0000 "application " | INHALATION_SPRAY | CUTANEOUS | Status: DC | PRN

## 2011-09-22 MED ORDER — FLEET ENEMA 7-19 GM/118ML RE ENEM: 1.0000 | ENEMA | RECTAL | Status: DC | PRN

## 2011-09-22 MED ORDER — TETANUS-DIPHTH-ACELL PERTUSSIS 5-2.5-18.5 LF-MCG/0.5 IM SUSP: 0.5000 mL | Freq: Once | INTRAMUSCULAR | Status: DC

## 2011-09-22 NOTE — Progress Notes (Signed)
Post Partum Day 2 Subjective: no complaints, up ad lib and + flatus  Objective: Blood pressure 112/71, pulse 79, temperature 98.4 F (36.9 C), temperature source Oral, resp. rate 18, height 5\' 4"  (1.626 m), weight 108.727 kg (239 lb 11.2 oz), last menstrual period 12/24/2010, SpO2 99.00%, unknown if currently breastfeeding.  Physical Exam:  General: alert and cooperative Lochia: appropriate Uterine Fundus: firm Incision: healing well DVT Evaluation: No evidence of DVT seen on physical exam.   Basename 09/22/11 0508 09/21/11 0535  HGB 8.9* 9.0*  HCT 28.1* 27.6*    Assessment/Plan: Discharge home   LOS: 3 days   Fabian Walder G 09/22/2011, 7:54 AM

## 2011-09-22 NOTE — Discharge Summary (Signed)
Obstetric Discharge Summary Reason for Admission: induction of labor Prenatal Procedures: none Intrapartum Procedures: spontaneous vaginal delivery Postpartum Procedures:pp magnesium sulfate Complications-Operative and Postpartum: 2 degree perineal laceration Hemoglobin  Date Value Range Status  09/22/2011 8.9* 12.0-15.0 (Mccoy/dL) Final     HCT  Date Value Range Status  09/22/2011 28.1* 36.0-46.0 (%) Final    Discharge Diagnoses: Term Pregnancy-delivered and Preelampsia  Discharge Information: Date: 09/22/2011 Activity: pelvic rest Diet: routine Medications: PNV, Ibuprofen and Percocet Condition: stable Instructions: refer to practice specific booklet Discharge to: home   Newborn Data: Live born female  Birth Weight: 6 lb 1.7 oz (2770 Mccoy) APGAR: 8, 9  Home with mother.  Victoria Mccoy 09/22/2011, 8:01 AM

## 2011-10-06 ENCOUNTER — Inpatient Hospital Stay (HOSPITAL_COMMUNITY)
Admission: AD | Admit: 2011-10-06 | Payer: BC Managed Care – PPO | Source: Ambulatory Visit | Admitting: Obstetrics and Gynecology

## 2012-01-23 ENCOUNTER — Emergency Department (HOSPITAL_COMMUNITY): Payer: BC Managed Care – PPO

## 2012-01-23 ENCOUNTER — Encounter (HOSPITAL_COMMUNITY): Payer: Self-pay | Admitting: Emergency Medicine

## 2012-01-23 ENCOUNTER — Emergency Department (HOSPITAL_COMMUNITY)
Admission: EM | Admit: 2012-01-23 | Discharge: 2012-01-23 | Disposition: A | Discharge status: Home / Self Care | Payer: BC Managed Care – PPO | Attending: Emergency Medicine | Admitting: Emergency Medicine

## 2012-01-23 DIAGNOSIS — F411 Generalized anxiety disorder: Secondary | ICD-10-CM | POA: Insufficient documentation

## 2012-01-23 DIAGNOSIS — R51 Headache: Secondary | ICD-10-CM | POA: Insufficient documentation

## 2012-01-23 DIAGNOSIS — J45909 Unspecified asthma, uncomplicated: Secondary | ICD-10-CM | POA: Insufficient documentation

## 2012-01-23 DIAGNOSIS — IMO0002 Reserved for concepts with insufficient information to code with codable children: Secondary | ICD-10-CM | POA: Insufficient documentation

## 2012-01-23 DIAGNOSIS — N809 Endometriosis, unspecified: Secondary | ICD-10-CM | POA: Insufficient documentation

## 2012-01-23 DIAGNOSIS — Z79899 Other long term (current) drug therapy: Secondary | ICD-10-CM | POA: Insufficient documentation

## 2012-01-23 MED ORDER — SODIUM CHLORIDE 0.9 % IV BOLUS (SEPSIS)
1000.0000 mL | Freq: Once | INTRAVENOUS | Status: AC
  Administered 2012-01-23: 1000 mL via INTRAVENOUS

## 2012-01-23 MED ORDER — DEXAMETHASONE SODIUM PHOSPHATE 10 MG/ML IJ SOLN
10.0000 mg | Freq: Once | INTRAMUSCULAR | Status: AC
  Administered 2012-01-23: 10 mg via INTRAVENOUS
  Filled 2012-01-23: qty 1

## 2012-01-23 MED ORDER — IBUPROFEN 800 MG PO TABS: 800.0000 mg | ORAL_TABLET | Freq: Three times a day (TID) | ORAL | Status: AC

## 2012-01-23 MED ORDER — METOCLOPRAMIDE HCL 5 MG/ML IJ SOLN
10.0000 mg | Freq: Once | INTRAMUSCULAR | Status: AC
  Administered 2012-01-23: 10 mg via INTRAVENOUS
  Filled 2012-01-23: qty 2

## 2012-01-23 MED ORDER — DIPHENHYDRAMINE HCL 50 MG/ML IJ SOLN
25.0000 mg | Freq: Once | INTRAMUSCULAR | Status: AC
  Administered 2012-01-23: 16:00:00 via INTRAVENOUS
  Filled 2012-01-23: qty 1

## 2012-01-23 NOTE — ED Provider Notes (Signed)
Medical screening examination/treatment/procedure(s) were performed by non-physician practitioner and as supervising physician I was immediately available for consultation/collaboration.   Gerhard Munch, MD 01/23/12 [phone]

## 2012-01-23 NOTE — ED Provider Notes (Signed)
History     CSN: [id number]  Arrival date & time 01/23/12  1338   First MD Initiated Contact with Patient 01/23/12 1447      Chief Complaint  Patient presents with  . Migraine    (Consider location/radiation/quality/duration/timing/severity/associated sxs/prior treatment) HPI History provided by pt.   Pt has had a severe headache since 8:30am today.  Started in frontal region and is now diffuse w/ radiation into neck.  Describes as sharp and pressure.  No relief w/ imitrex.  Associated w/ lightheadedness, N/V and photophobia.  Denies fever.  Denies trauma.  Has h/o migraines and these sx similar w/ exception of lightheadedness and lack of relief w/ imitrex.  Pt also has h/o meningitis in 2004 and she is concerned she may have it again.  Diagnosed w/ left OM yesterday.    Past Medical History  Diagnosis Date  . PCOS (polycystic ovarian syndrome)   . Endometriosis 2008    mild  . Pregnancy induced hypertension     with last pregnancy and this one  . Headache     migraines  . Depression     pp depression  . Anxiety     hx anxiety attacks  . Asthma   . PIH (pregnancy induced hypertension) 09/19/2011    Past Surgical History  Procedure Date  . Laparoscopy with co2 laser ablation 10/2010  . Tonsillectomy     31 years old  . Leg surgery     R leg growth plate 31 years old  . Mouth surgery   . Laparoscopy     111/2011    Family History  Problem Relation Age of Onset  . Thyroid disease Mother   . Arthritis Mother     rheumatoid  . Hypertension Maternal Grandmother   . Diabetes Maternal Grandmother   . Cancer Maternal Grandfather     prostate  . Hypertension Paternal Grandmother   . Cancer Paternal Grandfather     prostate   . Anesthesia problems Neg Hx   . Hypotension Neg Hx   . Malignant hyperthermia Neg Hx   . Pseudochol deficiency Neg Hx     History  Substance Use Topics  . Smoking status: Never Smoker   . Smokeless tobacco: Never Used  . Alcohol Use: No      OB History    Grav Para Term Preterm Abortions TAB SAB Ect Mult Living   2 2 2  0 0 0 0 0 0 2      Review of Systems  All other systems reviewed and are negative.    Allergies  Review of patient's allergies indicates no known allergies.  Home Medications   Current Outpatient Rx  Name Route Sig Dispense Refill  . ACETAMINOPHEN 500 MG PO TABS Oral Take 1,000 mg by mouth every 6 (six) hours as needed. Patient takes for pain     . ALPRAZOLAM 0.5 MG PO TABS Oral Take 0.5 mg by mouth 2 (two) times daily as needed. anxiety    . AZITHROMYCIN 250 MG PO TABS Oral Take 250 mg by mouth daily.    Marland Kitchen HYDROCODONE-HOMATROPINE 5-1.5 MG/5ML PO SYRP Oral Take 5 mLs by mouth every 6 (six) hours as needed. pain    . PREDNISONE 20 MG PO TABS Oral Take 20 mg by mouth daily. Take 3 tablets every day for 3 days then 2 tablets every day for 3 days then 1 tablet every day for 3 days    . SUMATRIPTAN SUCCINATE 50 MG PO TABS Oral  Take 50 mg by mouth every 2 (two) hours as needed. headache    . PRENATAL PLUS 27-1 MG PO TABS Oral Take 1 tablet by mouth daily.        BP 135/80  Pulse 81  Temp(Src) 97.3 F (36.3 C) (Oral)  Resp 16  SpO2 100%  LMP 12/24/2011  Physical Exam  Nursing note and vitals reviewed. Constitutional: She is oriented to person, place, and time. She appears well-developed and well-nourished. No distress.  HENT:  Head: Normocephalic and atraumatic.       Left and right TMs appear normal.  No edema, erythema or tenderness of left mastoid.   Eyes:       Normal appearance  Neck: Normal range of motion.  Cardiovascular: Normal rate and regular rhythm.   Pulmonary/Chest: Effort normal and breath sounds normal.  Musculoskeletal: Normal range of motion.  Neurological: She is alert and oriented to person, place, and time. She has normal reflexes. No cranial nerve deficit or sensory deficit. Coordination normal.       5/5 and equal upper and lower extremity strength.  No past pointing.      Skin: Skin is warm and dry. No rash noted.  Psychiatric: She has a normal mood and affect. Her behavior is normal.    ED Course  Procedures (including critical care time)  Labs Reviewed - No data to display Ct Head Wo Contrast  01/23/2012  *RADIOLOGY REPORT*  Clinical Data: Sudden onset headache.  CT HEAD WITHOUT CONTRAST  Technique:  Contiguous axial images were obtained from the base of the skull through the vertex without contrast.  Comparison: 02/16/2004.  Findings: No evidence of acute infarct, acute hemorrhage, mass lesion, mass effect or hydrocephalus.  Minimal mucosal thickening in the left maxillary sinus.  Mastoid air cells are clear.  IMPRESSION: No acute intracranial abnormality.  Original Report Authenticated By: Reyes Ivan, M.D.     1. Migraine       MDM  Healthy 31yo F presents w/ c/o headache.  H/o migraines and this pain feels similar but refractory to imitrex which normally give her relief and associated w/ lightheadedness for the first time.  She is concerned that she could possibly have meningitis d/t past history in 2004.  Denies fever.  On exam, afebrile, uncomfortable appearing, no focal neuro deficits or meningeal signs.  CT head ordered d/t slight change in characteristics of pain and is negative.  Pt received IV reglan, prednisone and benadryl and pain improved greatly.  I have low clinical suspicion for meningitis and I explained my reasons to patient.  She feels comfortable with and prefers being discharged and returning if pain worsens or she develops associated fever or neuro deficits.  Prescribed 800mg  ibuprofen.         Arie Sabina [place], [place] 01/23/12 1754

## 2012-01-23 NOTE — ED Notes (Signed)
Patient transported to CT 

## 2012-01-23 NOTE — Discharge Instructions (Signed)
Take ibuprofen w/ food up to three times a day, as needed for pain.  Follow up with your family doctor if headache persists.  Call [facility] [phone]) if you do not have a primary care doctor and would like assistance with finding one.   If your headache worsens or you develop associated fever or any other concerning symptoms, please return to the ER immediately.  Migraine Headache A migraine headache is an intense, throbbing pain on one or both sides of your head. The exact cause of a migraine headache is not always known. A migraine may be caused when nerves in the brain become irritated and release chemicals that cause swelling within blood vessels, causing pain. Many migraine sufferers have a family history of migraines. Before you get a migraine you may or may not get an aura. An aura is a group of symptoms that can predict the beginning of a migraine. An aura may include:  Visual changes such as:   Flashing lights.   Bright spots or zig-zag lines.   Tunnel vision.   Feelings of numbness.   Trouble talking.   Muscle weakness.  SYMPTOMS  Pain on one or both sides of your head.   Pain that is pulsating or throbbing in nature.   Pain that is severe enough to prevent daily activities.   Pain that is aggravated by any daily physical activity.   Nausea (feeling sick to your stomach), vomiting, or both.   Pain with exposure to bright lights, loud noises, or activity.   General sensitivity to bright lights or loud noises.  MIGRAINE TRIGGERS Examples of triggers of migraine headaches include:   Alcohol.   Smoking.   Stress.   It may be related to menses (female menstruation).   Aged cheeses.   Foods or drinks that contain nitrates, glutamate, aspartame, or tyramine.   Lack of sleep.   Chocolate.   Caffeine.   Hunger.   Medications such as nitroglycerine (used to treat chest pain), birth control pills, estrogen, and some blood pressure medications.  DIAGNOSIS    A migraine headache is often diagnosed based on:  Symptoms.   Physical examination.   A computerized X-ray scan (computed tomography, CT) of your head.  TREATMENT  Medications can help prevent migraines if they are recurrent or should they become recurrent. Your caregiver can help you with a medication or treatment program that will be helpful to you.   Lying down in a dark, quiet room may be helpful.   Keeping a headache diary may help you find a trend as to what may be triggering your headaches.  SEEK IMMEDIATE MEDICAL CARE IF:   You have confusion, personality changes or seizures.   You have headaches that wake you from sleep.   You have an increased frequency in your headaches.   You have a stiff neck.   You have a loss of vision.   You have muscle weakness.   You start losing your balance or have trouble walking.   You feel faint or pass out.  MAKE SURE YOU:   Understand these instructions.   Will watch your condition.   Will get help right away if you are not doing well or get worse.  Document Released: 11/21/2005 Document Revised: 08/03/2011 Document Reviewed: 07/07/2009 [facility] Patient Information 2012 [place], [place].

## 2012-01-23 NOTE — ED Notes (Signed)
Pt states migraine since 8am today, gradually worsening past couple hours. Pt reports nausea off and on, pt reports imitrex this am without any relief. Pt reports starting Zpack yesterday for L ear infection. Pt reports entire head hurts and posterior neck.

## 2012-01-23 NOTE — ED Notes (Signed)
Katie, PA at bedside.

## 2012-12-22 ENCOUNTER — Emergency Department (HOSPITAL_COMMUNITY)
Admission: EM | Admit: 2012-12-22 | Discharge: 2012-12-22 | Disposition: A | Discharge status: Home / Self Care | Payer: PRIVATE HEALTH INSURANCE | Attending: Emergency Medicine | Admitting: Emergency Medicine

## 2012-12-22 ENCOUNTER — Emergency Department (HOSPITAL_COMMUNITY): Payer: PRIVATE HEALTH INSURANCE

## 2012-12-22 ENCOUNTER — Encounter (HOSPITAL_COMMUNITY): Payer: Self-pay | Admitting: *Deleted

## 2012-12-22 DIAGNOSIS — R42 Dizziness and giddiness: Secondary | ICD-10-CM | POA: Insufficient documentation

## 2012-12-22 DIAGNOSIS — Z8742 Personal history of other diseases of the female genital tract: Secondary | ICD-10-CM | POA: Insufficient documentation

## 2012-12-22 DIAGNOSIS — F329 Major depressive disorder, single episode, unspecified: Secondary | ICD-10-CM | POA: Insufficient documentation

## 2012-12-22 DIAGNOSIS — Z79899 Other long term (current) drug therapy: Secondary | ICD-10-CM | POA: Insufficient documentation

## 2012-12-22 DIAGNOSIS — Z3202 Encounter for pregnancy test, result negative: Secondary | ICD-10-CM | POA: Insufficient documentation

## 2012-12-22 DIAGNOSIS — Z862 Personal history of diseases of the blood and blood-forming organs and certain disorders involving the immune mechanism: Secondary | ICD-10-CM | POA: Insufficient documentation

## 2012-12-22 DIAGNOSIS — J45909 Unspecified asthma, uncomplicated: Secondary | ICD-10-CM | POA: Insufficient documentation

## 2012-12-22 DIAGNOSIS — F411 Generalized anxiety disorder: Secondary | ICD-10-CM | POA: Insufficient documentation

## 2012-12-22 DIAGNOSIS — H53149 Visual discomfort, unspecified: Secondary | ICD-10-CM | POA: Insufficient documentation

## 2012-12-22 DIAGNOSIS — R079 Chest pain, unspecified: Secondary | ICD-10-CM

## 2012-12-22 DIAGNOSIS — F3289 Other specified depressive episodes: Secondary | ICD-10-CM | POA: Insufficient documentation

## 2012-12-22 DIAGNOSIS — Z8639 Personal history of other endocrine, nutritional and metabolic disease: Secondary | ICD-10-CM | POA: Insufficient documentation

## 2012-12-22 DIAGNOSIS — R0789 Other chest pain: Secondary | ICD-10-CM | POA: Insufficient documentation

## 2012-12-22 DIAGNOSIS — G43909 Migraine, unspecified, not intractable, without status migrainosus: Secondary | ICD-10-CM | POA: Insufficient documentation

## 2012-12-22 DIAGNOSIS — R112 Nausea with vomiting, unspecified: Secondary | ICD-10-CM | POA: Insufficient documentation

## 2012-12-22 DIAGNOSIS — T887XXA Unspecified adverse effect of drug or medicament, initial encounter: Secondary | ICD-10-CM

## 2012-12-22 LAB — BASIC METABOLIC PANEL
CO2: 22 mEq/L (ref 19–32)
Calcium: 9.7 mg/dL (ref 8.4–10.5)
Chloride: 100 mEq/L (ref 96–112)
Glucose, Bld: 107 mg/dL — ABNORMAL HIGH (ref 70–99)
Sodium: 136 mEq/L (ref 135–145)

## 2012-12-22 LAB — URINALYSIS, ROUTINE W REFLEX MICROSCOPIC
Glucose, UA: NEGATIVE mg/dL
Ketones, ur: NEGATIVE mg/dL
Leukocytes, UA: NEGATIVE
Specific Gravity, Urine: 1.005 (ref 1.005–1.030)
pH: 6 (ref 5.0–8.0)

## 2012-12-22 LAB — CBC
HCT: 43.1 % (ref 36.0–46.0)
Hemoglobin: 14.5 g/dL (ref 12.0–15.0)
MCHC: 33.6 g/dL (ref 30.0–36.0)

## 2012-12-22 MED ORDER — DIPHENHYDRAMINE HCL 50 MG/ML IJ SOLN
25.0000 mg | Freq: Once | INTRAMUSCULAR | Status: AC
  Administered 2012-12-22: 25 mg via INTRAVENOUS
  Filled 2012-12-22: qty 1

## 2012-12-22 MED ORDER — ONDANSETRON HCL 4 MG/2ML IJ SOLN
4.0000 mg | Freq: Once | INTRAMUSCULAR | Status: AC
  Administered 2012-12-22: 4 mg via INTRAVENOUS
  Filled 2012-12-22: qty 2

## 2012-12-22 MED ORDER — LORAZEPAM 2 MG/ML IJ SOLN
0.5000 mg | Freq: Once | INTRAMUSCULAR | Status: AC
  Administered 2012-12-22: 0.5 mg via INTRAVENOUS
  Filled 2012-12-22: qty 1

## 2012-12-22 MED ORDER — SODIUM CHLORIDE 0.9 % IV BOLUS (SEPSIS)
1000.0000 mL | Freq: Once | INTRAVENOUS | Status: AC
  Administered 2012-12-22: 1000 mL via INTRAVENOUS

## 2012-12-22 MED ORDER — KETOROLAC TROMETHAMINE 30 MG/ML IJ SOLN
30.0000 mg | Freq: Once | INTRAMUSCULAR | Status: AC
  Administered 2012-12-22: 30 mg via INTRAVENOUS
  Filled 2012-12-22: qty 1

## 2012-12-22 MED ORDER — METOCLOPRAMIDE HCL 5 MG/ML IJ SOLN
10.0000 mg | Freq: Once | INTRAMUSCULAR | Status: AC
  Administered 2012-12-22: 10 mg via INTRAVENOUS
  Filled 2012-12-22: qty 2

## 2012-12-22 NOTE — ED Notes (Signed)
PA at bedside.

## 2012-12-22 NOTE — ED Notes (Signed)
Patient transported to X-ray 

## 2012-12-22 NOTE — ED Provider Notes (Addendum)
32 year old female had onset last night of an occipital headache which is radiated to the neck. It is dull and throbbing and typical of her migraines although more severe than her typical migraines. There is associated photophobia, nausea, vomiting. She is also developed some chest pain along with that. She denies fever, chills, sweats denies cough. On exam, she is nontoxic in appearance. Fundi are unremarkable. Neck is completely supple. There no focal neurologic findings. She'll be treated with a migraine cocktail and reassessed.  Medical screening examination/treatment/procedure(s) were conducted as a shared visit with non-physician practitioner(s) and myself.  I personally evaluated the patient during the encounter   Dione Booze, MD 12/22/12 0725   Date: 12/22/2012  Rate: 113  Rhythm: sinus tachycardia  QRS Axis: normal  Intervals: normal  ST/T Wave abnormalities: normal  Conduction Disutrbances:none  Narrative Interpretation: Sinus tachycardia. When compared with ECG of 06/27/2007, no significant changes are seen.  Old EKG Reviewed: unchanged    Dione Booze, MD 12/22/12 [phone]

## 2012-12-22 NOTE — ED Provider Notes (Signed)
History     CSN: [id number]  Arrival date & time 12/22/12  0536   First MD Initiated Contact with Patient 12/22/12 [phone]      Chief Complaint  Patient presents with  . Migraine  . Weakness  . Chest Pain    (Consider location/radiation/quality/duration/timing/severity/associated sxs/prior treatment) HPI Victoria Mccoy is a 32 y.o. female who presents with complaint of headache onset last night, nausea, vomiting x2, photophobia, chest pressure. Pt states symptoms began with a headache that started out gradually, with first pressure behind her eyes and radiating down the head and neck. Pt states it felt like her usual migraine at that time. She took imitrex, which did not help. States soon after developed nausea, and chest pressure. States pressure in her chest is constant, but worsens randomly. Nothing making her head ache or chest pressure better or worse. States feels like both of her entire arms are numb, however denies weakness. Denies fever, chills, neck stiffness, abdominal pain. Did not take any other medications.  Past Medical History  Diagnosis Date  . PCOS (polycystic ovarian syndrome)   . Endometriosis 2008    mild  . Pregnancy induced hypertension     with last pregnancy and this one  . Headache     migraines  . Depression     pp depression  . Anxiety     hx anxiety attacks  . Asthma   . PIH (pregnancy induced hypertension) 09/19/2011    Past Surgical History  Procedure Date  . Laparoscopy with co2 laser ablation 10/2010  . Tonsillectomy     32 years old  . Leg surgery     R leg growth plate 32 years old  . Mouth surgery   . Laparoscopy     111/2011    Family History  Problem Relation Age of Onset  . Thyroid disease Mother   . Arthritis Mother     rheumatoid  . Hypertension Maternal Grandmother   . Diabetes Maternal Grandmother   . Cancer Maternal Grandfather     prostate  . Hypertension Paternal Grandmother   . Cancer Paternal Grandfather    prostate   . Anesthesia problems Neg Hx   . Hypotension Neg Hx   . Malignant hyperthermia Neg Hx   . Pseudochol deficiency Neg Hx     History  Substance Use Topics  . Smoking status: Never Smoker   . Smokeless tobacco: Never Used  . Alcohol Use: No    OB History    Grav Para Term Preterm Abortions TAB SAB Ect Mult Living   2 2 2  0 0 0 0 0 0 2      Review of Systems  Constitutional: Negative for fever and chills.  HENT: Positive for neck pain. Negative for ear pain, congestion, sore throat and neck stiffness.   Eyes: Positive for photophobia.  Respiratory: Negative.   Cardiovascular: Positive for chest pain. Negative for palpitations and leg swelling.  Gastrointestinal: Positive for nausea, vomiting and diarrhea. Negative for abdominal pain.  Genitourinary: Negative for dysuria and flank pain.  Musculoskeletal: Negative for myalgias.  Skin: Negative for rash.  Neurological: Positive for dizziness, weakness, light-headedness and headaches.  Psychiatric/Behavioral: The patient is nervous/anxious.     Allergies  Review of patient's allergies indicates no known allergies.  Home Medications   Current Outpatient Rx  Name  Route  Sig  Dispense  Refill  . ALBUTEROL SULFATE HFA 108 (90 BASE) MCG/ACT IN AERS   Inhalation   Inhale 2 puffs into  the lungs every 6 (six) hours as needed.         . ALPRAZOLAM 0.5 MG PO TABS   Oral   Take 0.5 mg by mouth 2 (two) times daily as needed. anxiety         . SUMATRIPTAN SUCCINATE 50 MG PO TABS   Oral   Take 50 mg by mouth every 2 (two) hours as needed. headache           BP 202/117  Pulse 114  Temp 98.3 F (36.8 C) (Oral)  Resp 19  Ht 5\' 4"  (1.626 m)  Wt 237 lb (107.502 kg)  BMI 40.68 kg/m2  SpO2 98%  LMP 12/08/2012  Breastfeeding? No  Physical Exam  Nursing note and vitals reviewed. Constitutional: She appears well-developed and well-nourished. No distress.  HENT:  Head: Normocephalic and atraumatic.  Right Ear:  External ear normal.  Left Ear: External ear normal.  Nose: Nose normal.  Mouth/Throat: Oropharynx is clear and moist.  Eyes: Conjunctivae normal are normal.  Neck: Normal range of motion. Neck supple.       No nuchal rigidity  Cardiovascular: Normal rate, regular rhythm and normal heart sounds.   Pulmonary/Chest: Effort normal and breath sounds normal. No respiratory distress. She has no wheezes. She has no rales.  Lymphadenopathy:    She has no cervical adenopathy.  Neurological: She is alert.       5/5 and equal upper and lower extremity strength bilaterally. Equal grip strength bilaterally. Normal finger to nose and heel to shin. No pronator drift. Normal sensation to sharp and soft touch over UE bilaterally  Skin: Skin is warm and dry.  Psychiatric: She has a normal mood and affect. Her behavior is normal.    ED Course  Procedures (including critical care time)  Pt appears anxious, complaining of a headache, similar to migraine but worse, with bilateral arm numbness, chest pressure. Labs pending. Migraine meds ordered. Gradual onset headache, doubt SAH, no signs of meningitis. Pt's VS are hypertensive, tachycardic.   Results for orders placed during the hospital encounter of 12/22/12  CBC      Component Value Range   WBC 11.4 (*) 4.0 - 10.5 K/uL   RBC 4.81  3.87 - 5.11 MIL/uL   Hemoglobin 14.5  12.0 - 15.0 g/dL   HCT 16.1  09.6 - 04.5 %   MCV 89.6  78.0 - 100.0 fL   MCH 30.1  26.0 - 34.0 pg   MCHC 33.6  30.0 - 36.0 g/dL   RDW 40.9  81.1 - 91.4 %   Platelets 332  150 - 400 K/uL  TROPONIN I      Component Value Range   Troponin I <0.30  <0.30 ng/mL  BASIC METABOLIC PANEL      Component Value Range   Sodium 136  135 - 145 mEq/L   Potassium 3.3 (*) 3.5 - 5.1 mEq/L   Chloride 100  96 - 112 mEq/L   CO2 22  19 - 32 mEq/L   Glucose, Bld 107 (*) 70 - 99 mg/dL   BUN 8  6 - 23 mg/dL   Creatinine, Ser 7.82  0.50 - 1.10 mg/dL   Calcium 9.7  8.4 - 95.6 mg/dL   GFR calc non Af  Amer >90  >90 mL/min   GFR calc Af Amer >90  >90 mL/min  PREGNANCY, URINE      Component Value Range   Preg Test, Ur NEGATIVE  NEGATIVE  URINALYSIS, ROUTINE W REFLEX  MICROSCOPIC      Component Value Range   Color, Urine YELLOW  YELLOW   APPearance CLEAR  CLEAR   Specific Gravity, Urine 1.005  1.005 - 1.030   pH 6.0  5.0 - 8.0   Glucose, UA NEGATIVE  NEGATIVE mg/dL   Hgb urine dipstick NEGATIVE  NEGATIVE   Bilirubin Urine NEGATIVE  NEGATIVE   Ketones, ur NEGATIVE  NEGATIVE mg/dL   Protein, ur NEGATIVE  NEGATIVE mg/dL   Urobilinogen, UA 0.2  0.0 - 1.0 mg/dL   Nitrite NEGATIVE  NEGATIVE   Leukocytes, UA NEGATIVE  NEGATIVE   Dg Chest 2 View  12/22/2012  *RADIOLOGY REPORT*  Clinical Data: Chest pain, migraines  CHEST - 2 VIEW  Comparison: 06/27/2007; 01/24/2007; chest CT - 06/27/2007  Findings: Grossly unchanged cardiac silhouette and mediastinal contours.  No focal parenchymal opacities.  Known nodular opacity within the left lower lobe is not well depicted on this examination.  No pleural effusion or pneumothorax.  Unchanged bones.  IMPRESSION: No acute cardiopulmonary disease.   Original Report Authenticated By: Tacey Ruiz, MD     9:39 AM Pt rechecked. Received 30mg  toradol, 10mg  of reglan, 25mg  benadryl, 0.5mg  of ativan. Completely pain free. VS back to normal. Neurovascularly intact. Ready for d/c home.   1. Migraine headache   2. Medication side effect   3. Chest pain       MDM  Pt with migraine headache, nausea, vomiting, chest pain. Labs unremarkable. Pt's symptoms improved with medications and IV fluids. She is pain free. VS normal. Will d/c home with close follow up. Doubt meningitis, doubt SAH. Question whether pt's CP and worsening headache could be caused by imitrex. Pt states last time she took it, her symptoms got worse.   Filed Vitals:   12/22/12 0702 12/22/12 0703 12/22/12 0705 12/22/12 0910  BP: 158/97 165/120 202/117 111/68  Pulse: 102 115 114 76  Temp:     97.9 F (36.6 C)  TempSrc:    Oral  Resp:    16  Height:      Weight:      SpO2:    99%           Lottie Mussel, PA 12/22/12 1518

## 2012-12-22 NOTE — ED Notes (Signed)
Pt started to have increasing chest pain, SOB after med push. Notified Tatiana, PA of pt status.

## 2012-12-22 NOTE — ED Notes (Signed)
Pt states migraine starting last night, progressed to neck pain. Pt presents w/ complaints of continued migraine, neck pain w/ chest pain and bilateral arm numbness starting at 0300 today. Pt c/o n/v x 2 since 0130.

## 2012-12-23 NOTE — ED Provider Notes (Signed)
Medical screening examination/treatment/procedure(s) were performed by non-physician practitioner and as supervising physician I was immediately available for consultation/collaboration.  Olivia Mackie, MD 12/23/12 [phone]

## 2014-10-06 ENCOUNTER — Encounter (HOSPITAL_COMMUNITY): Payer: Self-pay | Admitting: *Deleted

## 2015-04-09 NOTE — H&P (Addendum)
Victoria Mccoy is an 34 y.o. female. Presents for D&E due to embryonic demise.  Blood type A+    Menstrual History: No LMP recorded.    Past Medical History  Diagnosis Date  . PCOS (polycystic ovarian syndrome)   . Endometriosis 2008    mild  . Pregnancy induced hypertension     with last pregnancy and this one  . Headache(784.0)     migraines  . Depression     pp depression  . Anxiety     hx anxiety attacks  . Asthma   . PIH (pregnancy induced hypertension) 09/19/2011    Past Surgical History  Procedure Laterality Date  . Laparoscopy with co2 laser ablation  10/2010  . Tonsillectomy      34 years old  . Leg surgery      R leg growth plate 34 years old  . Mouth surgery    . Laparoscopy      111/2011    Family History  Problem Relation Age of Onset  . Thyroid disease Mother   . Arthritis Mother     rheumatoid  . Hypertension Maternal Grandmother   . Diabetes Maternal Grandmother   . Cancer Maternal Grandfather     prostate  . Hypertension Paternal Grandmother   . Cancer Paternal Grandfather     prostate   . Anesthesia problems Neg Hx   . Hypotension Neg Hx   . Malignant hyperthermia Neg Hx   . Pseudochol deficiency Neg Hx     Social History:  reports that she has never smoked. She has never used smokeless tobacco. She reports that she does not drink alcohol or use illicit drugs.  Allergies: No Known Allergies  No prescriptions prior to admission    ROS  There were no vitals taken for this visit. Physical Exam   US shows 6,5 EGA Embronic demise Cx cl thick high    No results found for this or any previous visit (from the past 24 hour(s)).  No results found.  Assessment/Plan: Embryonic demise at 6.5 weeks Pt desires D&E. R&B discussed informed consent obtained  Sheketa Ende C 04/09/2015, 5:51 PM   This patient has been seen and examined.   All of her questions were answered.  Labs and vital signs reviewed.  Informed consent has been  obtained.  The History and Physical is current. 04/10/15 1415 DL

## 2015-04-09 NOTE — Anesthesia Preprocedure Evaluation (Addendum)
Anesthesia Evaluation  Patient identified by MRN, date of birth, ID band Patient awake    Reviewed: Allergy & Precautions, NPO status , Patient's Chart, lab work & pertinent test results, reviewed documented beta blocker date and time   Airway Mallampati: II   Neck ROM: Full    Dental   Pulmonary asthma ,  breath sounds clear to auscultation        Cardiovascular hypertension (PIH), Rhythm:Regular     Neuro/Psych    GI/Hepatic   Endo/Other  Morbid obesity  Renal/GU    Poly cystic ovary    Musculoskeletal   Abdominal (+) + obese,   Peds  Hematology   Anesthesia Other Findings   Reproductive/Obstetrics                           Anesthesia Physical Anesthesia Plan  ASA: II  Anesthesia Plan: General   Post-op Pain Management:    Induction: Intravenous  Airway Management Planned: Oral ETT  Additional Equipment:   Intra-op Plan:   Post-operative Plan:   Informed Consent: I have reviewed the patients History and Physical, chart, labs and discussed the procedure including the risks, benefits and alternatives for the proposed anesthesia with the patient or authorized representative who has indicated his/her understanding and acceptance.     Plan Discussed with:   Anesthesia Plan Comments: (BMI 40 I would suggest GETA, check am labs)        Anesthesia Quick Evaluation

## 2015-04-10 ENCOUNTER — Ambulatory Visit (HOSPITAL_COMMUNITY): Payer: 59 | Admitting: Anesthesiology

## 2015-04-10 ENCOUNTER — Encounter (HOSPITAL_COMMUNITY)
Admission: AD | Disposition: A | Discharge status: Home / Self Care | Payer: Self-pay | Source: Ambulatory Visit | Attending: Obstetrics and Gynecology

## 2015-04-10 ENCOUNTER — Encounter (HOSPITAL_COMMUNITY): Payer: Self-pay | Admitting: *Deleted

## 2015-04-10 ENCOUNTER — Ambulatory Visit (HOSPITAL_COMMUNITY)
Admission: AD | Admit: 2015-04-10 | Discharge: 2015-04-10 | Disposition: A | Discharge status: Home / Self Care | Payer: 59 | Source: Ambulatory Visit | Attending: Obstetrics and Gynecology | Admitting: Obstetrics and Gynecology

## 2015-04-10 DIAGNOSIS — O021 Missed abortion: Secondary | ICD-10-CM | POA: Insufficient documentation

## 2015-04-10 DIAGNOSIS — J45909 Unspecified asthma, uncomplicated: Secondary | ICD-10-CM | POA: Diagnosis not present

## 2015-04-10 DIAGNOSIS — Z6836 Body mass index (BMI) 36.0-36.9, adult: Secondary | ICD-10-CM | POA: Insufficient documentation

## 2015-04-10 DIAGNOSIS — G43909 Migraine, unspecified, not intractable, without status migrainosus: Secondary | ICD-10-CM | POA: Insufficient documentation

## 2015-04-10 DIAGNOSIS — E282 Polycystic ovarian syndrome: Secondary | ICD-10-CM | POA: Insufficient documentation

## 2015-04-10 DIAGNOSIS — Z3A01 Less than 8 weeks gestation of pregnancy: Secondary | ICD-10-CM | POA: Insufficient documentation

## 2015-04-10 HISTORY — PX: DILATION AND EVACUATION: SHX1459

## 2015-04-10 LAB — CBC
HEMATOCRIT: 39.5 % (ref 36.0–46.0)
HEMOGLOBIN: 13.4 g/dL (ref 12.0–15.0)
MCH: 30.7 pg (ref 26.0–34.0)
MCHC: 33.9 g/dL (ref 30.0–36.0)
MCV: 90.4 fL (ref 78.0–100.0)
Platelets: 319 10*3/uL (ref 150–400)
RBC: 4.37 MIL/uL (ref 3.87–5.11)
RDW: 13 % (ref 11.5–15.5)
WBC: 11.9 10*3/uL — ABNORMAL HIGH (ref 4.0–10.5)

## 2015-04-10 SURGERY — DILATION AND EVACUATION, UTERUS
Anesthesia: General | Site: Vagina

## 2015-04-10 MED ORDER — SCOPOLAMINE 1 MG/3DAYS TD PT72
1.0000 | MEDICATED_PATCH | Freq: Once | TRANSDERMAL | Status: DC
  Administered 2015-04-10: 1.5 mg via TRANSDERMAL

## 2015-04-10 MED ORDER — MIDAZOLAM HCL 2 MG/2ML IJ SOLN
INTRAMUSCULAR | Status: DC | PRN
  Administered 2015-04-10: 2 mg via INTRAVENOUS

## 2015-04-10 MED ORDER — ACETAMINOPHEN 160 MG/5ML PO SOLN
ORAL | Status: AC
Start: 2015-04-10 — End: 2015-04-10
  Administered 2015-04-10: 975 mg via ORAL
  Filled 2015-04-10: qty 40.6

## 2015-04-10 MED ORDER — FENTANYL CITRATE (PF) 100 MCG/2ML IJ SOLN
INTRAMUSCULAR | Status: DC | PRN
  Administered 2015-04-10 (×2): 50 ug via INTRAVENOUS

## 2015-04-10 MED ORDER — LIDOCAINE HCL (CARDIAC) 20 MG/ML IV SOLN
INTRAVENOUS | Status: DC | PRN
  Administered 2015-04-10: 30 mg via INTRAVENOUS

## 2015-04-10 MED ORDER — MEPERIDINE HCL 25 MG/ML IJ SOLN: 6.2500 mg | INTRAMUSCULAR | Status: DC | PRN

## 2015-04-10 MED ORDER — LACTATED RINGERS IV SOLN
INTRAVENOUS | Status: DC
  Administered 2015-04-10 (×2): via INTRAVENOUS

## 2015-04-10 MED ORDER — PROPOFOL 10 MG/ML IV BOLUS
INTRAVENOUS | Status: AC
  Filled 2015-04-10: qty 20

## 2015-04-10 MED ORDER — FENTANYL CITRATE (PF) 100 MCG/2ML IJ SOLN
INTRAMUSCULAR | Status: AC
  Filled 2015-04-10: qty 2

## 2015-04-10 MED ORDER — FENTANYL CITRATE (PF) 100 MCG/2ML IJ SOLN
25.0000 ug | INTRAMUSCULAR | Status: DC | PRN
  Administered 2015-04-10 (×3): 50 ug via INTRAVENOUS

## 2015-04-10 MED ORDER — LIDOCAINE HCL (CARDIAC) 20 MG/ML IV SOLN
INTRAVENOUS | Status: AC
  Filled 2015-04-10: qty 5

## 2015-04-10 MED ORDER — FERRIC SUBSULFATE 259 MG/GM EX SOLN
CUTANEOUS | Status: AC
  Filled 2015-04-10: qty 8

## 2015-04-10 MED ORDER — ONDANSETRON HCL 4 MG/2ML IJ SOLN
INTRAMUSCULAR | Status: DC | PRN
  Administered 2015-04-10: 4 mg via INTRAVENOUS

## 2015-04-10 MED ORDER — KETOROLAC TROMETHAMINE 30 MG/ML IJ SOLN
INTRAMUSCULAR | Status: DC | PRN
  Administered 2015-04-10: 30 mg via INTRAVENOUS

## 2015-04-10 MED ORDER — DEXAMETHASONE SODIUM PHOSPHATE 4 MG/ML IJ SOLN
INTRAMUSCULAR | Status: AC
Start: 2015-04-10 — End: 2015-04-10
  Filled 2015-04-10: qty 1

## 2015-04-10 MED ORDER — PROMETHAZINE HCL 25 MG/ML IJ SOLN: 6.2500 mg | INTRAMUSCULAR | Status: DC | PRN

## 2015-04-10 MED ORDER — DEXAMETHASONE SODIUM PHOSPHATE 10 MG/ML IJ SOLN
INTRAMUSCULAR | Status: DC | PRN
  Administered 2015-04-10: 4 mg via INTRAVENOUS

## 2015-04-10 MED ORDER — KETOROLAC TROMETHAMINE 30 MG/ML IJ SOLN
INTRAMUSCULAR | Status: AC
  Filled 2015-04-10: qty 1

## 2015-04-10 MED ORDER — DEXTROSE 5 % IV SOLN
2.0000 g | INTRAVENOUS | Status: AC
  Administered 2015-04-10: 2 g via INTRAVENOUS
  Filled 2015-04-10: qty 2

## 2015-04-10 MED ORDER — METHYLERGONOVINE MALEATE 0.2 MG/ML IJ SOLN
INTRAMUSCULAR | Status: DC | PRN
  Administered 2015-04-10: 0.2 mg via INTRAMUSCULAR

## 2015-04-10 MED ORDER — SCOPOLAMINE 1 MG/3DAYS TD PT72
MEDICATED_PATCH | TRANSDERMAL | Status: AC
  Administered 2015-04-10: 1.5 mg via TRANSDERMAL
  Filled 2015-04-10: qty 1

## 2015-04-10 MED ORDER — PROPOFOL 10 MG/ML IV BOLUS
INTRAVENOUS | Status: DC | PRN
  Administered 2015-04-10: 180 mg via INTRAVENOUS
  Administered 2015-04-10: 50 mg via INTRAVENOUS

## 2015-04-10 MED ORDER — FENTANYL CITRATE (PF) 100 MCG/2ML IJ SOLN
INTRAMUSCULAR | Status: AC
  Administered 2015-04-10: 50 ug via INTRAVENOUS
  Filled 2015-04-10: qty 2

## 2015-04-10 MED ORDER — LIDOCAINE-EPINEPHRINE 1 %-1:100000 IJ SOLN
INTRAMUSCULAR | Status: AC
  Filled 2015-04-10: qty 1

## 2015-04-10 MED ORDER — ONDANSETRON HCL 4 MG/2ML IJ SOLN
INTRAMUSCULAR | Status: AC
  Filled 2015-04-10: qty 2

## 2015-04-10 MED ORDER — MIDAZOLAM HCL 2 MG/2ML IJ SOLN
INTRAMUSCULAR | Status: AC
  Filled 2015-04-10: qty 2

## 2015-04-10 MED ORDER — ACETAMINOPHEN 160 MG/5ML PO SOLN
975.0000 mg | Freq: Once | ORAL | Status: AC
  Administered 2015-04-10: 975 mg via ORAL

## 2015-04-10 SURGICAL SUPPLY — 17 items
CATH ROBINSON RED A/P 16FR (CATHETERS) ×2 IMPLANT
CLOTH BEACON ORANGE TIMEOUT ST (SAFETY) ×2 IMPLANT
DECANTER SPIKE VIAL GLASS SM (MISCELLANEOUS) ×2 IMPLANT
GLOVE BIO SURGEON STRL SZ8 (GLOVE) ×2 IMPLANT
GLOVE SURG ORTHO 8.0 STRL STRW (GLOVE) ×2 IMPLANT
GOWN STRL REUS W/TWL LRG LVL3 (GOWN DISPOSABLE) ×4 IMPLANT
KIT BERKELEY 1ST TRIMESTER 3/8 (MISCELLANEOUS) ×2 IMPLANT
NS IRRIG 1000ML POUR BTL (IV SOLUTION) ×2 IMPLANT
PACK VAGINAL MINOR WOMEN LF (CUSTOM PROCEDURE TRAY) ×2 IMPLANT
PAD OB MATERNITY 4.3X12.25 (PERSONAL CARE ITEMS) ×2 IMPLANT
PAD PREP 24X48 CUFFED NSTRL (MISCELLANEOUS) ×2 IMPLANT
SET BERKELEY SUCTION TUBING (SUCTIONS) ×2 IMPLANT
TOWEL OR 17X24 6PK STRL BLUE (TOWEL DISPOSABLE) ×4 IMPLANT
VACURETTE 10 RIGID CVD (CANNULA) IMPLANT
VACURETTE 7MM CVD STRL WRAP (CANNULA) IMPLANT
VACURETTE 8 RIGID CVD (CANNULA) IMPLANT
VACURETTE 9 RIGID CVD (CANNULA) ×1 IMPLANT

## 2015-04-10 NOTE — Progress Notes (Signed)
Called Dr. Rana SnareLowe at approximately 1600 to report patients c/o RLQ pain 5-6/10 unrelieved by narcotics given in PACU.  He assured that procedure went well and there were no complications to merit that much pain.  Continued to monitor and patient said she would like to try and get up to empty her bladder.  She dressed and husband was brought back to PACU.  Discharge instructions were reviewed.  Patient had Sprite and crackers and stated she would like to go home.  Advised that she should monitor her pain and to call  MD on call if pain got worse at home.  Stated at time of d/c her pain was 3-4 and she wanted to go home.

## 2015-04-10 NOTE — Anesthesia Procedure Notes (Signed)
Procedure Name: LMA Insertion Date/Time: 04/10/2015 2:35 PM Performed by: Suella GroveMOORE, Haruto Demaria C Pre-anesthesia Checklist: Patient identified, Emergency Drugs available, Suction available, Patient being monitored and Timeout performed Oxygen Delivery Method: Circle system utilized and Simple face mask Preoxygenation: Pre-oxygenation with 100% oxygen Intubation Type: IV induction Ventilation: Mask ventilation without difficulty LMA: LMA inserted LMA Size: 4.0 Number of attempts: 1 Placement Confirmation: breath sounds checked- equal and bilateral and positive ETCO2 Dental Injury: Teeth and Oropharynx as per pre-operative assessment

## 2015-04-10 NOTE — Transfer of Care (Signed)
Immediate Anesthesia Transfer of Care Note  Patient: Victoria HumanLori Melberg  Procedure(s) Performed: Procedure(s) with comments: DILATATION AND EVACUATION (N/A) - blood type is A positive  Patient Location: PACU  Anesthesia Type:General  Level of Consciousness: awake, alert , oriented and patient cooperative  Airway & Oxygen Therapy: Patient Spontanous Breathing and Patient connected to nasal cannula oxygen  Post-op Assessment: Report given to RN and Post -op Vital signs reviewed and stable  Post vital signs: Reviewed and stable  Last Vitals:  Filed Vitals:   04/10/15 1341  BP: 126/73  Pulse:   Temp:   Resp:     Complications: No apparent anesthesia complications

## 2015-04-10 NOTE — Brief Op Note (Signed)
04/10/2015  2:53 PM  PATIENT:  Osvaldo HumanLori Galka  34 y.o. female  PRE-OPERATIVE DIAGNOSIS:  missed ab   POST-OPERATIVE DIAGNOSIS:  missed abortion  PROCEDURE:  Procedure(s) with comments: DILATATION AND EVACUATION (N/A) - blood type is A positive  SURGEON:  Surgeon(s) and Role:    * Candice Campavid Noam Karaffa, MD - Primary  PHYSICIAN ASSISTANT:   ASSISTANTS: none   ANESTHESIA:   general  EBL:  Total I/O In: -  Out: 25 [Urine:25]  BLOOD ADMINISTERED:none  DRAINS: none   LOCAL MEDICATIONS USED:  NONE  SPECIMEN:  Source of Specimen:  POC  DISPOSITION OF SPECIMEN:  PATHOLOGY  COUNTS:  YES  TOURNIQUET:  * No tourniquets in log *  DICTATION: .Other Dictation: Dictation Number 1  PLAN OF CARE: Discharge to home after PACU  PATIENT DISPOSITION:  PACU - hemodynamically stable.   Delay start of Pharmacological VTE agent (>24hrs) due to surgical blood loss or risk of bleeding: not applicable

## 2015-04-11 NOTE — Op Note (Signed)
NAMKarie Mccoy:  Ogburn, Ronesha              ACCOUNT NO.:  [id number]  MEDICAL RECORD NO.:  [id number]  LOCATION:  [facility]                          FACILITY:  [facility]  PHYSICIAN:  Dineen KidDavid C. Rana SnareLowe, M.D.    DATE OF BIRTH:  [birth year]  DATE OF PROCEDURE:  04/10/2015 DATE OF DISCHARGE:  04/10/2015                              OPERATIVE REPORT   PREOPERATIVE DIAGNOSIS:  Embryonic demise at 646 and half weeks gestational age.  POSTOPERATIVE DIAGNOSIS:  Embryonic demise at 716 and half weeks gestational age.  PROCEDURE:  Dilation and evacuation.  SURGEON:  Dineen Kidavid C. Rana SnareLowe, M.D.  ANESTHESIA:  General by LMA.  INDICATIONS:  [name] is a 34 year old, who presented yesterday to the office for a followup ultrasound evaluation for the pregnancy confirmation showing embryonic demise at 426 and half weeks gestational age.  She desires dilation and evacuation and presents for that.  The risks and benefits were discussed at length.  Informed consent was obtained.  She is A-positive blood type.  DESCRIPTION OF PROCEDURE:  After adequate analgesia, the patient was placed in dorsal lithotomy position.  She is sterilely prepped, draped, and bladder was sterilely drained.  A speculum was placed.  Tenaculum placed at the anterior lip of the cervix.  Uterus sounded to 8 cm and easily dilated to a #21 Pratt dilator.  A 7 mm suction curette was then inserted and suction curettage was performed until a gritty surface was felt throughout the endometrial cavity.  Methergine 0.2 mg was given IM with good uterine response.  The curette was then removed.  Tenaculum removed from the cervix and noted to be hemostatic.  The patient was then transferred to recovery room in stable condition.  Sponge and instrument count was normal x3.  Estimated blood loss was minimal.  The patient received 2 g of cefotetan preoperatively 0.2 mg of Methergine intraoperatively and Toradol 30 mg IV intraoperatively.  DISPOSITION:  The patient will be  discharged home with routine instruction for D and E.  Told to return for increased pain, fever, or bleeding, otherwise, she will follow up in the office in 2-3 weeks.     Dineen Kidavid C. Rana SnareLowe, M.D.     DCL/MEDQ  D:  04/10/2015  T:  04/11/2015  Job:  [id number]

## 2015-04-12 NOTE — Anesthesia Postprocedure Evaluation (Signed)
  Anesthesia Post-op Note  Patient: Victoria Mccoy  Procedure(s) Performed: Procedure(s) with comments: DILATATION AND EVACUATION (N/A) - blood type is A positive  Patient Location: PACU  Anesthesia Type:General  Level of Consciousness: awake and alert   Airway and Oxygen Therapy: Patient Spontanous Breathing  Post-op Pain: mild  Post-op Assessment: Post-op Vital signs reviewed  Post-op Vital Signs: Reviewed and stable  Last Vitals:  Filed Vitals:   04/10/15 1600  BP: 118/75  Pulse: 89  Temp: 37.3 C  Resp: 10    Complications: No apparent anesthesia complications

## 2015-04-13 ENCOUNTER — Encounter (HOSPITAL_COMMUNITY): Payer: Self-pay | Admitting: Obstetrics and Gynecology

## 2017-11-07 DIAGNOSIS — I10 Essential (primary) hypertension: Secondary | ICD-10-CM | POA: Diagnosis not present

## 2017-12-27 DIAGNOSIS — M898X5 Other specified disorders of bone, thigh: Secondary | ICD-10-CM | POA: Diagnosis not present

## 2018-05-08 DIAGNOSIS — I10 Essential (primary) hypertension: Secondary | ICD-10-CM | POA: Diagnosis not present

## 2018-05-08 DIAGNOSIS — E785 Hyperlipidemia, unspecified: Secondary | ICD-10-CM | POA: Diagnosis not present

## 2018-08-28 DIAGNOSIS — J069 Acute upper respiratory infection, unspecified: Secondary | ICD-10-CM | POA: Diagnosis not present

## 2018-08-29 DIAGNOSIS — J45901 Unspecified asthma with (acute) exacerbation: Secondary | ICD-10-CM | POA: Diagnosis not present

## 2018-11-07 DIAGNOSIS — H9202 Otalgia, left ear: Secondary | ICD-10-CM | POA: Diagnosis not present

## 2018-11-07 DIAGNOSIS — I10 Essential (primary) hypertension: Secondary | ICD-10-CM | POA: Diagnosis not present

## 2018-12-11 DIAGNOSIS — J0100 Acute maxillary sinusitis, unspecified: Secondary | ICD-10-CM | POA: Diagnosis not present

## 2018-12-31 DIAGNOSIS — R05 Cough: Secondary | ICD-10-CM | POA: Diagnosis not present

## 2018-12-31 DIAGNOSIS — J069 Acute upper respiratory infection, unspecified: Secondary | ICD-10-CM | POA: Diagnosis not present

## 2019-03-14 DIAGNOSIS — I10 Essential (primary) hypertension: Secondary | ICD-10-CM | POA: Diagnosis not present

## 2021-09-02 ENCOUNTER — Emergency Department (HOSPITAL_COMMUNITY): Admission: EM | Admit: 2021-09-02 | Discharge: 2021-09-02 | Discharge status: Against Medical Advice | Payer: 59

## 2021-09-02 NOTE — ED Notes (Signed)
No answer for triage.
